# Patient Record
Sex: Male | Born: 1950 | Race: White | Hispanic: No | Marital: Single | Smoking: Former smoker
Health system: Southern US, Community
[De-identification: ages and names within clinical notes are randomized; demographics above are authoritative.]

## PROBLEM LIST (undated history)

## (undated) DIAGNOSIS — R112 Nausea with vomiting, unspecified: Secondary | ICD-10-CM

## (undated) DIAGNOSIS — K458 Other specified abdominal hernia without obstruction or gangrene: Secondary | ICD-10-CM

## (undated) DIAGNOSIS — J449 Chronic obstructive pulmonary disease, unspecified: Secondary | ICD-10-CM

## (undated) DIAGNOSIS — R0600 Dyspnea, unspecified: Secondary | ICD-10-CM

## (undated) DIAGNOSIS — T8859XA Other complications of anesthesia, initial encounter: Secondary | ICD-10-CM

## (undated) DIAGNOSIS — J45909 Unspecified asthma, uncomplicated: Secondary | ICD-10-CM

## (undated) DIAGNOSIS — M25561 Pain in right knee: Secondary | ICD-10-CM

## (undated) DIAGNOSIS — K219 Gastro-esophageal reflux disease without esophagitis: Secondary | ICD-10-CM

## (undated) DIAGNOSIS — Z87442 Personal history of urinary calculi: Secondary | ICD-10-CM

## (undated) DIAGNOSIS — T4145XA Adverse effect of unspecified anesthetic, initial encounter: Secondary | ICD-10-CM

## (undated) DIAGNOSIS — F330 Major depressive disorder, recurrent, mild: Secondary | ICD-10-CM

## (undated) DIAGNOSIS — M1990 Unspecified osteoarthritis, unspecified site: Secondary | ICD-10-CM

## (undated) DIAGNOSIS — N138 Other obstructive and reflux uropathy: Secondary | ICD-10-CM

## (undated) DIAGNOSIS — E785 Hyperlipidemia, unspecified: Secondary | ICD-10-CM

## (undated) DIAGNOSIS — K4090 Unilateral inguinal hernia, without obstruction or gangrene, not specified as recurrent: Secondary | ICD-10-CM

## (undated) DIAGNOSIS — Z9889 Other specified postprocedural states: Secondary | ICD-10-CM

## (undated) HISTORY — PX: CATARACT EXTRACTION: SUR2

## (undated) HISTORY — PX: COLONOSCOPY: SHX174

## (undated) HISTORY — PX: BACK SURGERY: SHX140

## (undated) HISTORY — PX: OTHER SURGICAL HISTORY: SHX169

## (undated) HISTORY — PX: ESOPHAGOGASTRODUODENOSCOPY: SHX1529

## (undated) HISTORY — PX: RETINAL DETACHMENT SURGERY: SHX105

## (undated) HISTORY — PX: NOSE SURGERY: SHX723

---

## 1991-02-07 HISTORY — PX: OTHER SURGICAL HISTORY: SHX169

## 2002-08-01 ENCOUNTER — Encounter: Payer: Self-pay | Admitting: Ophthalmology

## 2002-08-01 ENCOUNTER — Ambulatory Visit (HOSPITAL_COMMUNITY): Admission: RE | Admit: 2002-08-01 | Discharge: 2002-08-02 | Payer: Self-pay | Admitting: Ophthalmology

## 2004-02-10 ENCOUNTER — Ambulatory Visit: Payer: Self-pay | Admitting: Urology

## 2004-05-16 ENCOUNTER — Ambulatory Visit: Payer: Self-pay | Admitting: Internal Medicine

## 2007-04-19 ENCOUNTER — Ambulatory Visit: Payer: Self-pay | Admitting: Nurse Practitioner

## 2008-02-07 HISTORY — PX: HEMORROIDECTOMY: SUR656

## 2012-08-27 ENCOUNTER — Ambulatory Visit: Payer: Self-pay | Admitting: Emergency Medicine

## 2012-08-28 ENCOUNTER — Ambulatory Visit: Payer: Self-pay | Admitting: Emergency Medicine

## 2012-09-19 ENCOUNTER — Ambulatory Visit: Payer: Self-pay | Admitting: Surgery

## 2012-09-19 LAB — CBC WITH DIFFERENTIAL/PLATELET
Eosinophil #: 1 10*3/uL — ABNORMAL HIGH (ref 0.0–0.7)
Eosinophil %: 13.4 %
HCT: 46.9 % (ref 40.0–52.0)
Lymphocyte %: 19.9 %
Neutrophil #: 3.9 10*3/uL (ref 1.4–6.5)
RBC: 5.56 10*6/uL (ref 4.40–5.90)

## 2012-09-19 LAB — BASIC METABOLIC PANEL
EGFR (African American): >60
EGFR (Non-African Amer.): >60
Glucose: 91 mg/dL (ref 65–99)
Osmolality: 274 (ref 275–301)
Potassium: 4.4 mmol/L (ref 3.5–5.1)

## 2012-09-25 ENCOUNTER — Ambulatory Visit: Payer: Self-pay | Admitting: Surgery

## 2014-05-29 NOTE — Op Note (Signed)
PATIENT NAME:  Eric Boyer, Eric Boyer MR#:  [id number] DATE OF BIRTH:  01/21/1951  DATE OF PROCEDURE:  08/27/2012  This patient who is losing weight   and he also has a history of reflux was brought in for upper GI endoscopy.   After putting the scope from the esophagus, which was normal, the middle of esophagus was normal, GE junction was normal. Entering into the stomach the patient had a very large J-shaped stomach. It looked like it was going towards the chest, and by advancing the scope near the prepyloric area I could not enter the duodenum because all of the scope was in the stomach.  I tried many, many times but the pylorus could not be cannulated because of the anatomy of his stomach. I did not see any obvious tumor in the stomach or any mass, or any ulcer disease, but I could not see the duodenum.   RECOMMENDATION: I am going to recommend an upper GI series on him to look at the duodenum and look at the stomach, see whether he has any paraesophageal hernia or not.   I may not be here next monthto see his upper endoscopy results, so I will request that his family doctor to please look for the results in the x-ray department.   PROCEDURE: Colonoscopy.   FINDINGS: Normal colonoscopy. The patient was found to have a few diverticula in the colon, but I was able to go all the way down to the cecum. No evidence of tumor or polyp was seen in the colon.     ____________________________ Alton RevereMasud S. Cecelia ByarsHashmi, MD msh:dm D: 08/27/2012 08:53:22 ET T: 08/27/2012 09:30:47 ET JOB#: [id number]  cc: Emberly Tomasso S. Cecelia ByarsHashmi, MD, <Dictator> Meindert A. Lacie ScottsNiemeyer, MD Meryle ReadyMASUD S Suhail Peloquin MD ELECTRONICALLY SIGNED 08/27/2012 9:46

## 2014-05-29 NOTE — Op Note (Signed)
PATIENT NAME:  Cecilie LowersKIMREY, Jayro M MR#:  [id number] DATE OF BIRTH:  August 28, 1950  DATE OF PROCEDURE:  09/25/2012  PREOPERATIVE DIAGNOSIS: Left back lipoma.   POSTOPERATIVE DIAGNOSIS: Left back lipoma.   PROCEDURE PERFORMED:  Excision of left back lipoma.   SURGEON: Amiliah Campisi A. Mattix Imhof, M.D.   ESTIMATED BLOOD LOSS:  10 mL.  COMPLICATIONS: None.   SPECIMEN: Left back mass with skin pedicle.   ANESTHESIA: MAC local.   INDICATION FOR SURGERY: Mr. Bess KindsKimrey is a pleasant 64 year old male with a tender lower back nodule which was nonfixed and appeared to be relatively nontender. This was felt to be a lipoma and as it was symptomatic I offered him resection.     DETAILS OF PROCEDURE: Formal consent was obtained.  He was laid prone on the operating room table.  I then made an ellipse over his skin and went down to the lipoma. It appeared to be all encapsulated. I then dissected out the capsule on the lipoma using a combination of blunt dissection and Bovie electrocautery. The mass was then sent to pathology. The wound was then irrigated and was made hemostatic. The wound is closed in 2 layers with a running 3-0 Monocryl deep dermal and a 4-0 Monocryl subcuticular. Steri-Strips, Telfa gauze and Tegaderm were then used to complete the dressing. The patient was then awoken, extubated and brought to the postanesthesia care unit. There were no immediate complications. Needle, sponge, and instrument counts were correct at the end of the procedure.      ____________________________ Si Raiderhristopher A. Shields Pautz, MD cal:dp D: 09/25/2012 11:17:04 ET T: 09/25/2012 12:17:53 ET JOB#: [id number]  cc: Cristal Deerhristopher A. Jayvien Rowlette, MD, <Dictator> Jarvis NewcomerHRISTOPHER A Breckyn Ticas MD ELECTRONICALLY SIGNED 10/01/2012 14:59

## 2014-08-26 ENCOUNTER — Other Ambulatory Visit: Payer: Self-pay | Admitting: Nurse Practitioner

## 2014-08-26 DIAGNOSIS — R1010 Upper abdominal pain, unspecified: Secondary | ICD-10-CM

## 2014-09-01 ENCOUNTER — Ambulatory Visit
Admission: RE | Admit: 2014-09-01 | Discharge: 2014-09-01 | Disposition: A | Discharge status: Home / Self Care | Payer: Medicaid Other | Source: Ambulatory Visit | Attending: Nurse Practitioner | Admitting: Nurse Practitioner

## 2014-09-01 DIAGNOSIS — R1010 Upper abdominal pain, unspecified: Secondary | ICD-10-CM

## 2014-09-01 DIAGNOSIS — R948 Abnormal results of function studies of other organs and systems: Secondary | ICD-10-CM | POA: Diagnosis not present

## 2014-09-01 DIAGNOSIS — K5710 Diverticulosis of small intestine without perforation or abscess without bleeding: Secondary | ICD-10-CM | POA: Insufficient documentation

## 2014-09-01 HISTORY — DX: Unspecified asthma, uncomplicated: J45.909

## 2014-09-01 MED ORDER — IOHEXOL 300 MG/ML  SOLN
100.0000 mL | Freq: Once | INTRAMUSCULAR | Status: AC | PRN
  Administered 2014-09-01: 100 mL via INTRAVENOUS

## 2014-10-01 ENCOUNTER — Encounter: Payer: Self-pay | Admitting: *Deleted

## 2014-10-02 ENCOUNTER — Ambulatory Visit: Payer: Medicaid Other | Admitting: Anesthesiology

## 2014-10-02 ENCOUNTER — Encounter
Admission: RE | Disposition: A | Discharge status: Home / Self Care | Payer: Self-pay | Source: Ambulatory Visit | Attending: Unknown Physician Specialty

## 2014-10-02 ENCOUNTER — Encounter: Payer: Self-pay | Admitting: *Deleted

## 2014-10-02 ENCOUNTER — Ambulatory Visit
Admission: RE | Admit: 2014-10-02 | Discharge: 2014-10-02 | Disposition: A | Discharge status: Home / Self Care | Payer: Medicaid Other | Source: Ambulatory Visit | Attending: Unknown Physician Specialty | Admitting: Unknown Physician Specialty

## 2014-10-02 DIAGNOSIS — J449 Chronic obstructive pulmonary disease, unspecified: Secondary | ICD-10-CM | POA: Diagnosis not present

## 2014-10-02 DIAGNOSIS — E785 Hyperlipidemia, unspecified: Secondary | ICD-10-CM | POA: Insufficient documentation

## 2014-10-02 DIAGNOSIS — Z800 Family history of malignant neoplasm of digestive organs: Secondary | ICD-10-CM | POA: Diagnosis not present

## 2014-10-02 DIAGNOSIS — Z87891 Personal history of nicotine dependence: Secondary | ICD-10-CM | POA: Diagnosis not present

## 2014-10-02 DIAGNOSIS — Z1211 Encounter for screening for malignant neoplasm of colon: Secondary | ICD-10-CM | POA: Diagnosis not present

## 2014-10-02 DIAGNOSIS — I517 Cardiomegaly: Secondary | ICD-10-CM | POA: Insufficient documentation

## 2014-10-02 DIAGNOSIS — M1990 Unspecified osteoarthritis, unspecified site: Secondary | ICD-10-CM | POA: Insufficient documentation

## 2014-10-02 DIAGNOSIS — J45909 Unspecified asthma, uncomplicated: Secondary | ICD-10-CM | POA: Insufficient documentation

## 2014-10-02 DIAGNOSIS — K640 First degree hemorrhoids: Secondary | ICD-10-CM | POA: Insufficient documentation

## 2014-10-02 DIAGNOSIS — Z79899 Other long term (current) drug therapy: Secondary | ICD-10-CM | POA: Diagnosis not present

## 2014-10-02 DIAGNOSIS — I2510 Atherosclerotic heart disease of native coronary artery without angina pectoris: Secondary | ICD-10-CM | POA: Insufficient documentation

## 2014-10-02 DIAGNOSIS — K5730 Diverticulosis of large intestine without perforation or abscess without bleeding: Secondary | ICD-10-CM | POA: Insufficient documentation

## 2014-10-02 HISTORY — DX: Unspecified osteoarthritis, unspecified site: M19.90

## 2014-10-02 HISTORY — DX: Other complications of anesthesia, initial encounter: T88.59XA

## 2014-10-02 HISTORY — DX: Chronic obstructive pulmonary disease, unspecified: J44.9

## 2014-10-02 HISTORY — DX: Hyperlipidemia, unspecified: E78.5

## 2014-10-02 HISTORY — DX: Adverse effect of unspecified anesthetic, initial encounter: T41.45XA

## 2014-10-02 HISTORY — DX: Nausea with vomiting, unspecified: R11.2

## 2014-10-02 HISTORY — PX: COLONOSCOPY WITH PROPOFOL: SHX5780

## 2014-10-02 HISTORY — DX: Other specified postprocedural states: Z98.890

## 2014-10-02 SURGERY — COLONOSCOPY WITH PROPOFOL
Anesthesia: General

## 2014-10-02 MED ORDER — EPHEDRINE SULFATE 50 MG/ML IJ SOLN
INTRAMUSCULAR | Status: DC | PRN
  Administered 2014-10-02: 5 mg via INTRAVENOUS

## 2014-10-02 MED ORDER — PHENYLEPHRINE HCL 10 MG/ML IJ SOLN
INTRAMUSCULAR | Status: DC | PRN
  Administered 2014-10-02 (×2): 50 ug via INTRAVENOUS

## 2014-10-02 MED ORDER — PROPOFOL INFUSION 10 MG/ML OPTIME
INTRAVENOUS | Status: DC | PRN
  Administered 2014-10-02: 120 ug/kg/min via INTRAVENOUS

## 2014-10-02 MED ORDER — PROPOFOL 10 MG/ML IV BOLUS
INTRAVENOUS | Status: DC | PRN
  Administered 2014-10-02: 80 mg via INTRAVENOUS

## 2014-10-02 MED ORDER — SODIUM CHLORIDE 0.9 % IV SOLN: INTRAVENOUS | Status: DC

## 2014-10-02 MED ORDER — LACTATED RINGERS IV SOLN
INTRAVENOUS | Status: DC | PRN
  Administered 2014-10-02: 11:00:00 via INTRAVENOUS

## 2014-10-02 NOTE — H&P (Signed)
   Primary Care Physician:  Domenic Schwab, FNP Primary Gastroenterologist:  Dr. Mechele Collin  Pre-Procedure History & Physical: HPI:  Eric Boyer is a 64 y.o. male is here for an colonoscopy.   Past Medical History  Diagnosis Date  . Asthma   . COPD (chronic obstructive pulmonary disease)   . Osteoarthritis   . Hyperlipidemia   . Complication of anesthesia   . PONV (postoperative nausea and vomiting)     Past Surgical History  Procedure Laterality Date  . Cataract extraction    . Brain tumor surgery    . Nose surgery    . Retinal detachment surgery    . Esophagogastroduodenoscopy    . Colonoscopy      Prior to Admission medications   Medication Sig Start Date End Date Taking? Authorizing Provider  albuterol (PROVENTIL HFA;VENTOLIN HFA) 108 (90 BASE) MCG/ACT inhaler Inhale into the lungs every 6 (six) hours as needed for wheezing or shortness of breath.   Yes Historical Provider, MD  atorvastatin (LIPITOR) 10 MG tablet Take 10 mg by mouth daily.   Yes Historical Provider, MD  Fluticasone-Salmeterol (ADVAIR) 250-50 MCG/DOSE AEPB Inhale 1 puff into the lungs 2 (two) times daily.   Yes Historical Provider, MD  lipase/protease/amylase (CREON) 12000 UNITS CPEP capsule Take by mouth.   Yes Historical Provider, MD  omeprazole (PRILOSEC) 20 MG capsule Take 20 mg by mouth daily.   Yes Historical Provider, MD  ondansetron (ZOFRAN-ODT) 4 MG disintegrating tablet Take 4 mg by mouth every 8 (eight) hours as needed for nausea or vomiting.   Yes Historical Provider, MD  tiotropium (SPIRIVA) 18 MCG inhalation capsule Place 18 mcg into inhaler and inhale daily.   Yes Historical Provider, MD    Allergies as of 09/07/2014  . (No Known Allergies)    History reviewed. No pertinent family history.  Social History   Social History  . Marital Status: Single    Spouse Name: N/A  . Number of Children: N/A  . Years of Education: N/A   Occupational History  . Not on file.   Social  History Main Topics  . Smoking status: Former Games developer  . Smokeless tobacco: Never Used  . Alcohol Use: No  . Drug Use: No  . Sexual Activity: Not on file   Other Topics Concern  . Not on file   Social History Narrative    Review of Systems: See HPI, otherwise negative ROS  Physical Exam: BP 111/64 mmHg  Pulse 88  Temp(Src) 100.1 F (37.8 C) (Tympanic)  Resp 14  Ht 6' (1.829 m)  Wt 69.4 kg (153 lb)  BMI 20.75 kg/m2  SpO2 96% General:   Alert,  pleasant and cooperative in NAD Head:  Normocephalic and atraumatic. Neck:  Supple; no masses or thyromegaly. Lungs:  Clear throughout to auscultation.    Heart:  Regular rate and rhythm. Abdomen:  Soft, nontender and nondistended. Normal bowel sounds, without guarding, and without rebound.   Neurologic:  Alert and  oriented x4;  grossly normal neurologically.  Impression/Plan: Eric Boyer is here for an colonoscopy to be performed for FH colon cancer in mother  Risks, benefits, limitations, and alternatives regarding  colonoscopy have been reviewed with the patient.  Questions have been answered.  All parties agreeable.   Lynnae Prude, MD  10/02/2014, 11:16 AM

## 2014-10-02 NOTE — Anesthesia Postprocedure Evaluation (Signed)
  Anesthesia Post-op Note  Patient: Eric Boyer  Procedure(s) Performed: Procedure(s): COLONOSCOPY WITH PROPOFOL (N/A)  Anesthesia type:General  Patient location: PACU  Post pain: Pain level controlled  Post assessment: Post-op Vital signs reviewed, Patient's Cardiovascular Status Stable, Respiratory Function Stable, Patent Airway and No signs of Nausea or vomiting  Post vital signs: Reviewed and stable  Last Vitals:  Filed Vitals:   10/02/14 1150  BP: 85/46  Pulse: 80  Temp: 35.6 C  Resp: 16    Level of consciousness: awake, alert  and patient cooperative  Complications: No apparent anesthesia complications

## 2014-10-02 NOTE — Anesthesia Preprocedure Evaluation (Addendum)
Anesthesia Evaluation  Patient identified by MRN, date of birth, ID band Patient awake    Reviewed: Allergy & Precautions, NPO status , Patient's Chart, lab work & pertinent test results  History of Anesthesia Complications (+) history of anesthetic complications (pt denies)  Airway Mallampati: II       Dental  (+) Upper Dentures, Lower Dentures   Pulmonary COPD COPD inhaler, former smoker (quit x 30 yrs),          Cardiovascular + CAD (cardiomegaly)     Neuro/Psych    GI/Hepatic GERD-  Medicated and Controlled,  Endo/Other    Renal/GU      Musculoskeletal  (+) Arthritis -,   Abdominal   Peds  Hematology   Anesthesia Other Findings   Reproductive/Obstetrics                            Anesthesia Physical Anesthesia Plan  ASA: III  Anesthesia Plan: General   Post-op Pain Management:    Induction: Intravenous  Airway Management Planned: Nasal Cannula  Additional Equipment:   Intra-op Plan:   Post-operative Plan:   Informed Consent: I have reviewed the patients History and Physical, chart, labs and discussed the procedure including the risks, benefits and alternatives for the proposed anesthesia with the patient or authorized representative who has indicated his/her understanding and acceptance.     Plan Discussed with:   Anesthesia Plan Comments:         Anesthesia Quick Evaluation

## 2014-10-02 NOTE — Transfer of Care (Signed)
Immediate Anesthesia Transfer of Care Note  Patient: Eric Boyer  Procedure(s) Performed: Procedure(s): COLONOSCOPY WITH PROPOFOL (N/A)  Patient Location: PACU  Anesthesia Type:General  Level of Consciousness: awake, alert  and oriented  Airway & Oxygen Therapy: Patient Spontanous Breathing and Patient connected to nasal cannula oxygen  Post-op Assessment: Report given to RN and Post -op Vital signs reviewed and stable  Post vital signs: Reviewed and stable  Last Vitals:  Filed Vitals:   10/02/14 1146  BP:   Pulse:   Temp: 35.6 C  Resp:     Complications: No apparent anesthesia complications

## 2014-10-02 NOTE — Op Note (Signed)
[facility] Patient Name: Eric Boyer Procedure Date: 10/02/2014 11:16 AM MRN: [id number] Account #: [id number] Date of Birth: [birth year] Admit Type: Outpatient Age: 64 Room: [facility] ENDO ROOM 1 Gender: Male Note Status: Finalized Procedure:         Colonoscopy Indications:       Screening in patient at increased risk: Family history of                     1st-degree relative with colorectal cancer Providers:         Scot Jun, MD Referring MD:      Meindert A. Lacie Scotts, MD (Referring MD) Medicines:         Propofol per Anesthesia Complications:     No immediate complications. Procedure:         Pre-Anesthesia Assessment:                    - After reviewing the risks and benefits, the patient was                     deemed in satisfactory condition to undergo the procedure.                    After obtaining informed consent, the colonoscope was                     passed under direct vision. Throughout the procedure, the                     patient's blood pressure, pulse, and oxygen saturations                     were monitored continuously. The Colonoscope was                     introduced through the anus and advanced to the the cecum,                     identified by appendiceal orifice and ileocecal valve. The                     colonoscopy was performed without difficulty. The patient                     tolerated the procedure well. The quality of the bowel                     preparation was excellent. Findings:      Many small-mouthed diverticula were found in the sigmoid colon.      Internal hemorrhoids were found during endoscopy. The hemorrhoids were       small and Grade I (internal hemorrhoids that do not prolapse). Impression:        - Diverticulosis in the sigmoid colon.                    - Internal hemorrhoids.                    - No specimens collected. Recommendation:    - Repeat colonoscopy in 5 years for  screening purposes. Scot Jun, MD 10/02/2014 11:43:32 AM This report has been signed electronically. Number of Addenda: 0 Note Initiated On: 10/02/2014 11:16 AM Scope Withdrawal Time: 0 hours 11 minutes 37 seconds  Total Procedure  Duration: 0 hours 17 minutes 58 seconds       [facility]

## 2015-09-28 NOTE — Progress Notes (Signed)
Cardiology Office Note   Date:  09/29/2015   ID:  Eric LowersRichard M Petre, DOB 1950-09-28, MRN [id number]  Referring Doctor:  Domenic SchwabLindley,Cheryl Paulette, FNP   Cardiologist:   Almond LintAileen Kosisochukwu Goldberg, MD   Reason for consultation:  Chief Complaint  Patient presents with  . Other    Ref by Yolonda Kidaherly Lindley, FNP for abnormal Echo.       History of Present Illness: Eric Boyer is a 65 y.o. male who presents for "abnormal echo"  Per available echo report dated 09/08/2015, there were no visible cardiac windows. Was a technically difficult study.  Patient has long-standing history of shortness of breath. This is with walking, relieved after a few minutes post albuterol use. He can usually walk about a mile before he gets short of breath. He uses his inhaler right away and notices significant improvement with of his breathing. He denies chest pain, chest tightness, palpitations, lightheadedness, PND, orthopnea, edema.   ROS:  Please see the history of present illness. Aside from mentioned under HPI, all other systems are reviewed and negative.     Past Medical History:  Diagnosis Date  . Asthma   . Complication of anesthesia   . COPD (chronic obstructive pulmonary disease) (HCC)   . Hyperlipidemia   . Osteoarthritis   . PONV (postoperative nausea and vomiting)     Past Surgical History:  Procedure Laterality Date  . brain tumor surgery    . CATARACT EXTRACTION    . COLONOSCOPY    . COLONOSCOPY WITH PROPOFOL N/A 10/02/2014   Procedure: COLONOSCOPY WITH PROPOFOL;  Surgeon: Scot Junobert T Elliott, MD;  [facility] ENDOSCOPY;  Service: Endoscopy;  Laterality: N/A;  . ESOPHAGOGASTRODUODENOSCOPY    . NOSE SURGERY    . RETINAL DETACHMENT SURGERY       reports that he has quit smoking. He has never used smokeless tobacco. He reports that he does not drink alcohol or use drugs. He smoked at age [age] only.  family history includes Hypertension in his mother and sister. No known CAD in the  family.  Outpatient Medications Prior to Visit  Medication Sig Dispense Refill  . albuterol (PROVENTIL HFA;VENTOLIN HFA) 108 (90 BASE) MCG/ACT inhaler Inhale into the lungs every 6 (six) hours as needed for wheezing or shortness of breath.    . Fluticasone-Salmeterol (ADVAIR) 250-50 MCG/DOSE AEPB Inhale 1 puff into the lungs 2 (two) times daily.    Marland Kitchen. tiotropium (SPIRIVA) 18 MCG inhalation capsule Place 18 mcg into inhaler and inhale daily.    Marland Kitchen. atorvastatin (LIPITOR) 10 MG tablet Take 10 mg by mouth daily.    . lipase/protease/amylase (CREON) 12000 UNITS CPEP capsule Take by mouth.    Marland Kitchen. omeprazole (PRILOSEC) 20 MG capsule Take 20 mg by mouth daily.    . ondansetron (ZOFRAN-ODT) 4 MG disintegrating tablet Take 4 mg by mouth every 8 (eight) hours as needed for nausea or vomiting.     No facility-administered medications prior to visit.      Allergies: Review of patient's allergies indicates no known allergies.    PHYSICAL EXAM: VS:  BP 98/60 (BP Location: Right Arm, Patient Position: Sitting)   Pulse 71   Ht 6' (1.829 m)   Wt 154 lb (69.9 kg)   BMI 20.89 kg/m  , Body mass index is 20.89 kg/m. Wt Readings from Last 3 Encounters:  09/29/15 154 lb (69.9 kg)  10/02/14 153 lb (69.4 kg)  09/01/14 153 lb (69.4 kg)    GENERAL:  well developed, well  nourished, obese, not in acute distress HEENT: normocephalic, pink conjunctivae, anicteric sclerae, no xanthelasma, normal dentition, oropharynx clear NECK:  no neck vein engorgement, JVP normal, no hepatojugular reflux, carotid upstroke brisk and symmetric, no bruit, no thyromegaly, no lymphadenopathy LUNGS:  good respiratory effort, clear to auscultation bilaterally CV:  PMI not displaced, no thrills, no lifts, S1 and S2 within normal limits, no palpable S3 or S4, no murmurs, no rubs, no gallops ABD:  Soft, nontender, nondistended, normoactive bowel sounds, no abdominal aortic bruit, no hepatomegaly, no splenomegaly MS: nontender back, no  kyphosis, no scoliosis, no joint deformities EXT:  2+ DP/PT pulses, no edema, no varicosities, no cyanosis, no clubbing SKIN: warm, nondiaphoretic, normal turgor, no ulcers NEUROPSYCH: alert, oriented to person, place, and time, sensory/motor grossly intact, normal mood, appropriate affect  Recent Labs: No results found for requested labs within last 8760 hours.   Lipid Panel No results found for: CHOL, TRIG, HDL, CHOLHDL, VLDL, LDLCALC, LDLDIRECT   Other studies Reviewed:  EKG:  The ekg from08/23/2017 was personally reviewed by me and it revealed sinus rhythm, 71 BPM. Nonspecific ST-T wave changes.  Additional studies/ records that were reviewed personally reviewed by me today include: None available   ASSESSMENT AND PLAN:  Shortness of breath Recommend echocardiogram for PCP request. Original echocardiogram was technically difficult and no useful information gained from initial report.  Likely related to COPD or asthma. Patient advised to monitor for symptoms of chest tightness chest pains, or shortness of breath does not improve right away with his inhalers. We'll consider stress testing at that time.  Current medicines are reviewed at length with the patient today.  The patient does not have concerns regarding medicines.  Labs/ tests ordered today include:  Orders Placed This Encounter  Procedures  . EKG 12-Lead  . ECHOCARDIOGRAM COMPLETE    I had a lengthy and detailed discussion with the patient regarding diagnoses, prognosis, diagnostic options, treatment options.   I counseled the patient on importance of lifestyle modification including heart healthy diet, regular physical activity.  Disposition:   FU with undersigned after tests  I spent at least 45 minutes with the patient today and more than 50% of the time was spent counseling the patient and coordinating care.     Signed, Almond LintAileen Chapin Arduini, MD  09/29/2015 1:29 PM    [facility]  This  note was generated in part with voice recognition software and I apologize for any typographical errors that were not detected and corrected.

## 2015-09-29 ENCOUNTER — Ambulatory Visit (INDEPENDENT_AMBULATORY_CARE_PROVIDER_SITE_OTHER): Payer: Medicaid Other | Admitting: Cardiology

## 2015-09-29 ENCOUNTER — Encounter: Payer: Self-pay | Admitting: Cardiology

## 2015-09-29 VITALS — BP 98/60 | HR 71 | Ht 72.0 in | Wt 154.0 lb

## 2015-09-29 DIAGNOSIS — R0602 Shortness of breath: Secondary | ICD-10-CM | POA: Diagnosis not present

## 2015-09-29 NOTE — Patient Instructions (Addendum)
Testing/Procedures: Your physician has requested that you have an echocardiogram. Echocardiography is a painless test that uses sound waves to create images of your heart. It provides your doctor with information about the size and shape of your heart and how well your heart's chambers and valves are working. This procedure takes approximately one hour. There are no restrictions for this procedure.   Follow-Up: Your physician recommends that you schedule a follow-up appointment after testing with Dr. Alvino ChapelIngal.  It was a pleasure seeing you today here in the office. Please do not hesitate to give us a call back if you have any further questions. [phone]  Crystal CellarPamela A. RN, BSN

## 2015-10-01 ENCOUNTER — Other Ambulatory Visit: Payer: Medicaid Other

## 2015-10-13 ENCOUNTER — Ambulatory Visit: Payer: Medicaid Other | Admitting: Cardiology

## 2015-10-15 ENCOUNTER — Ambulatory Visit (INDEPENDENT_AMBULATORY_CARE_PROVIDER_SITE_OTHER): Payer: Medicaid Other

## 2015-10-15 ENCOUNTER — Other Ambulatory Visit: Payer: Self-pay

## 2015-10-15 DIAGNOSIS — R0602 Shortness of breath: Secondary | ICD-10-CM

## 2016-02-07 HISTORY — PX: OTHER SURGICAL HISTORY: SHX169

## 2018-04-08 ENCOUNTER — Other Ambulatory Visit: Payer: Self-pay | Admitting: Family Medicine

## 2018-04-08 DIAGNOSIS — M5416 Radiculopathy, lumbar region: Secondary | ICD-10-CM

## 2018-04-17 ENCOUNTER — Other Ambulatory Visit: Payer: Self-pay

## 2018-04-17 ENCOUNTER — Ambulatory Visit
Admission: RE | Admit: 2018-04-17 | Discharge: 2018-04-17 | Disposition: A | Discharge status: Home / Self Care | Payer: Medicare Other | Source: Ambulatory Visit | Attending: Family Medicine | Admitting: Family Medicine

## 2018-04-17 DIAGNOSIS — M5416 Radiculopathy, lumbar region: Secondary | ICD-10-CM | POA: Diagnosis not present

## 2019-03-19 MED ORDER — ALBUTEROL SULFATE HFA 108 (90 BASE) MCG/ACT IN AERS
2.00 | INHALATION_SPRAY | RESPIRATORY_TRACT | Status: DC
Start: ? — End: 2019-03-19

## 2019-03-19 MED ORDER — ATORVASTATIN CALCIUM 20 MG PO TABS
20.00 | ORAL_TABLET | ORAL | Status: DC
Start: 2019-03-19 — End: 2019-03-19

## 2019-03-19 MED ORDER — BUDESONIDE-FORMOTEROL FUMARATE 80-4.5 MCG/ACT IN AERO
2.00 | INHALATION_SPRAY | RESPIRATORY_TRACT | Status: DC
Start: 2019-03-19 — End: 2019-03-19

## 2019-03-19 MED ORDER — CITALOPRAM HYDROBROMIDE 20 MG PO TABS
10.00 | ORAL_TABLET | ORAL | Status: DC
Start: 2019-03-20 — End: 2019-03-19

## 2019-03-19 MED ORDER — BISACODYL 10 MG RE SUPP
10.00 | RECTAL | Status: DC
Start: 2019-03-20 — End: 2019-03-19

## 2019-03-19 MED ORDER — METHOCARBAMOL 500 MG PO TABS
500.00 | ORAL_TABLET | ORAL | Status: DC
Start: 2019-03-19 — End: 2019-03-19

## 2019-03-19 MED ORDER — POTASSIUM CHLORIDE ER 10 MEQ PO CPCR
10.00 | ORAL_CAPSULE | ORAL | Status: DC
Start: 2019-03-20 — End: 2019-03-19

## 2019-03-19 MED ORDER — CHOLECALCIFEROL 25 MCG (1000 UT) PO TABS
5000.00 | ORAL_TABLET | ORAL | Status: DC
Start: 2019-03-20 — End: 2019-03-19

## 2019-03-19 MED ORDER — GENERIC EXTERNAL MEDICATION
Status: DC
Start: ? — End: 2019-03-19

## 2019-03-19 MED ORDER — CELECOXIB 100 MG PO CAPS
100.00 | ORAL_CAPSULE | ORAL | Status: DC
Start: 2019-03-19 — End: 2019-03-19

## 2019-03-19 MED ORDER — K PHOS MONO-SOD PHOS DI & MONO 155-852-130 MG PO TABS
500.00 | ORAL_TABLET | ORAL | Status: DC
Start: 2019-03-19 — End: 2019-03-19

## 2019-03-19 MED ORDER — OXYCODONE HCL 5 MG PO TABS
5.00 | ORAL_TABLET | ORAL | Status: DC
Start: ? — End: 2019-03-19

## 2019-03-19 MED ORDER — CALCIUM CARBONATE 1250 (500 CA) MG PO TABS
ORAL_TABLET | ORAL | Status: DC
Start: 2019-03-19 — End: 2019-03-19

## 2019-03-19 MED ORDER — TIOTROPIUM BROMIDE MONOHYDRATE 18 MCG IN CAPS
18.00 | ORAL_CAPSULE | RESPIRATORY_TRACT | Status: DC
Start: 2019-03-20 — End: 2019-03-19

## 2019-03-19 MED ORDER — ENOXAPARIN SODIUM 40 MG/0.4ML ~~LOC~~ SOLN
40.00 | SUBCUTANEOUS | Status: DC
Start: 2019-03-20 — End: 2019-03-19

## 2019-03-19 MED ORDER — PANTOPRAZOLE SODIUM 40 MG PO TBEC
40.00 | DELAYED_RELEASE_TABLET | ORAL | Status: DC
Start: 2019-03-20 — End: 2019-03-19

## 2019-03-19 MED ORDER — POLYETHYLENE GLYCOL 3350 17 GM/SCOOP PO POWD
17.00 | ORAL | Status: DC
Start: 2019-03-19 — End: 2019-03-19

## 2019-03-19 MED ORDER — SENNOSIDES-DOCUSATE SODIUM 8.6-50 MG PO TABS
2.00 | ORAL_TABLET | ORAL | Status: DC
Start: 2019-03-19 — End: 2019-03-19

## 2019-03-19 MED ORDER — ACETAMINOPHEN 325 MG PO TABS
975.00 | ORAL_TABLET | ORAL | Status: DC
Start: ? — End: 2019-03-19

## 2019-03-19 MED ORDER — LIDOCAINE 5 % EX PTCH
2.00 | MEDICATED_PATCH | CUTANEOUS | Status: DC
Start: 2019-03-20 — End: 2019-03-19

## 2019-03-31 ENCOUNTER — Ambulatory Visit (INDEPENDENT_AMBULATORY_CARE_PROVIDER_SITE_OTHER): Payer: Medicare Other | Admitting: Urology

## 2019-03-31 ENCOUNTER — Other Ambulatory Visit: Payer: Self-pay

## 2019-03-31 ENCOUNTER — Encounter: Payer: Self-pay | Admitting: Urology

## 2019-03-31 ENCOUNTER — Ambulatory Visit: Payer: Medicare Other | Admitting: Physician Assistant

## 2019-03-31 VITALS — BP 124/81 | HR 94 | Ht 72.0 in | Wt 153.0 lb

## 2019-03-31 DIAGNOSIS — R339 Retention of urine, unspecified: Secondary | ICD-10-CM

## 2019-03-31 LAB — BLADDER SCAN AMB NON-IMAGING: Scan Result: 120

## 2019-03-31 NOTE — Progress Notes (Signed)
03/31/2019 8:46 AM   Gerlene Burdock Precious Haws 1950/09/07 [id number]  Referring provider: Gracelyn Nurse, MD [address] [place],  [place] [address]  Chief Complaint  Patient presents with  . Follow-up    HPI: Patient saw Dr. Kathie Rhodes 2017 with stone left kidney and a calyceal diverticulum.  He might have been on Flomax and.  2 weeks ago he had spinal fusion lower back and a failed trial of voiding.  No longer on Flomax.  He was told he might have microscopic hematuria prior to or during the admission.  I did not see evidence of this  Prior to this sometimes flow good other times hard to start.  He will get up 3-4 times a night.  Hourly frequency during the day.       PMH: Past Medical History:  Diagnosis Date  . Asthma   . Complication of anesthesia   . COPD (chronic obstructive pulmonary disease) (HCC)   . Hyperlipidemia   . Osteoarthritis   . PONV (postoperative nausea and vomiting)     Surgical History: Past Surgical History:  Procedure Laterality Date  . brain tumor surgery    . CATARACT EXTRACTION    . COLONOSCOPY    . COLONOSCOPY WITH PROPOFOL N/A 10/02/2014   Procedure: COLONOSCOPY WITH PROPOFOL;  Surgeon: Scot Jun, MD;  Location: [facility] ENDOSCOPY;  Service: Endoscopy;  Laterality: N/A;  . ESOPHAGOGASTRODUODENOSCOPY    . NOSE SURGERY    . RETINAL DETACHMENT SURGERY      Home Medications:  Allergies as of 03/31/2019   No Known Allergies     Medication List       Accurate as of March 31, 2019  8:46 AM. If you have any questions, ask your nurse or doctor.        albuterol 108 (90 Base) MCG/ACT inhaler Commonly known as: VENTOLIN HFA Inhale into the lungs every 6 (six) hours as needed for wheezing or shortness of breath.   alendronate 70 MG tablet Commonly known as: FOSAMAX Take 70 mg by mouth. Wednesday   atorvastatin 20 MG tablet Commonly known as: LIPITOR Take by mouth.   calcium carbonate 1250 (500 Ca) MG chewable tablet Commonly  known as: OS-CAL Chew by mouth.   citalopram 10 MG tablet Commonly known as: CELEXA Take 10 mg by mouth daily.   fluticasone furoate-vilanterol 100-25 MCG/INH Aepb Commonly known as: BREO ELLIPTA Inhale into the lungs.   Fluticasone-Salmeterol 250-50 MCG/DOSE Aepb Commonly known as: ADVAIR Inhale 1 puff into the lungs 2 (two) times daily.   oxyCODONE 5 MG immediate release tablet Commonly known as: Oxy IR/ROXICODONE Take 5 mg by mouth every 6 (six) hours as needed.   ranitidine 150 MG capsule Commonly known as: ZANTAC Take 150 mg by mouth 2 (two) times daily.   tiotropium 18 MCG inhalation capsule Commonly known as: SPIRIVA Place 18 mcg into inhaler and inhale daily.   Vitamin D3 125 MCG (5000 UT) Tabs Take 5,000 Units by mouth daily.       Allergies: No Known Allergies  Family History: Family History  Problem Relation Age of Onset  . Hypertension Mother   . Hypertension Sister     Social History:  reports that he has quit smoking. He has never used smokeless tobacco. He reports that he does not drink alcohol or use drugs.  ROS:  Physical Exam: BP 124/81   Pulse 94   Ht 6' (1.829 m)   Wt 153 lb (69.4 kg)   BMI 20.75 kg/m   Constitutional:  Alert and oriented, No acute distress. HEENT: [place] AT, moist mucus membranes.  Trachea midline, no masses. Cardiovascular: No clubbing, cyanosis, or edema. Respiratory: Normal respiratory effort, no increased work of breathing. GI: Abdomen is soft, nontender, nondistended, no abdominal masses GU: Exam a little bit limiting but 50 to 60 g benign prostate Skin: No rashes, bruises or suspicious lesions. Lymph: No cervical or inguinal adenopathy. Neurologic: Grossly intact, no focal deficits, moving all 4 extremities. Psychiatric: Normal mood and affect.  Laboratory Data: Lab Results  Component Value Date   WBC 7.2 09/19/2012   HGB 16.2 09/19/2012   HCT  46.9 09/19/2012   MCV 84 09/19/2012   PLT 282 09/19/2012    Lab Results  Component Value Date   CREATININE 0.86 09/19/2012    No results found for: PSA  No results found for: TESTOSTERONE  No results found for: HGBA1C  Urinalysis No results found for: COLORURINE, APPEARANCEUR, LABSPEC, PHURINE, GLUCOSEU, HGBUR, BILIRUBINUR, KETONESUR, PROTEINUR, UROBILINOGEN, NITRITE, LEUKOCYTESUR  Pertinent Imaging:   Assessment & Plan: Pathophysiology of retention discussed.  Voiding trial discussed.  Depending on how he does he may benefit from Flomax.  I will send urine for urine culture recognizing it will likely be colonized  There are no diagnoses linked to this encounter.  No follow-ups on file.  Reece Packer, MD  [facility] [address], [place],  [address] [phone]

## 2019-03-31 NOTE — Progress Notes (Signed)
Patient returned to clinic this afternoon for follow-up PVR.  He reports drinking approximately 20 ounces of water during the day and has been able to urinate.  PVR 120 mL.  Voiding trial passed.  No further intervention indicated.  Results for orders placed or performed in visit on 03/31/19  BLADDER SCAN AMB NON-IMAGING  Result Value Ref Range   Scan Result 120     I counseled the patient on signs and symptoms of urinary retention, including lower abdominal pain, lumbar pain, abdominal distention, and the inability to urinate.  I advised him to contact the office for assistance if he develops these symptoms during routine office hours, 8 AM to 5 PM Monday through Friday.  If outside those hours, I advised him  to proceed to the emergency department. He expressed understanding.  Return if symptoms worsen or fail to improve.   Carman Ching, PA-C  03/31/19 12:46 PM

## 2019-03-31 NOTE — Progress Notes (Signed)
Fill and Pull Catheter Removal  Patient is present today for a catheter removal.  Patient was cleaned and prepped in a sterile fashion of sterile water/ saline was instilled into the bladder when the patient felt the urge to urinate. 74ml of water was then drained from the balloon.  A 16FR foley cath was removed from the bladder no complications were noted .  Patient as then given some time to void on their own.  Patient can not void  on their own after some time.  Patient tolerated well.  Performed by: Milas Kocher, CMA  Follow up/ Additional notes: return this aftenoon

## 2019-03-31 NOTE — Addendum Note (Signed)
Addended by: Debarah Crape on: 03/31/2019 01:01 PM   Modules accepted: Orders

## 2020-05-19 ENCOUNTER — Ambulatory Visit: Payer: Medicare Other | Admitting: Dermatology

## 2020-12-16 DIAGNOSIS — J449 Chronic obstructive pulmonary disease, unspecified: Secondary | ICD-10-CM | POA: Diagnosis not present

## 2020-12-23 DIAGNOSIS — F330 Major depressive disorder, recurrent, mild: Secondary | ICD-10-CM | POA: Diagnosis not present

## 2020-12-23 DIAGNOSIS — E782 Mixed hyperlipidemia: Secondary | ICD-10-CM | POA: Diagnosis not present

## 2020-12-23 DIAGNOSIS — K219 Gastro-esophageal reflux disease without esophagitis: Secondary | ICD-10-CM | POA: Diagnosis not present

## 2020-12-23 DIAGNOSIS — J449 Chronic obstructive pulmonary disease, unspecified: Secondary | ICD-10-CM | POA: Diagnosis not present

## 2021-01-18 IMAGING — MR MRI LUMBAR SPINE WITHOUT CONTRAST
4 of 5 series · 25 of 48 positions shown · non-contrast
Comparison: CT 09/01/2014

CLINICAL DATA: Left leg pain with numbness and tingling extending
to the foot.

EXAM:
MRI LUMBAR SPINE WITHOUT CONTRAST
TECHNIQUE: Multiplanar, multisequence MR imaging of the lumbar spine was
performed. No intravenous contrast was administered.

[Series 2: T2 · sagittal · 4.0mm · 0.81mm/px · 6 of 17 slices shown (1 of 2)]
[im 1/17]
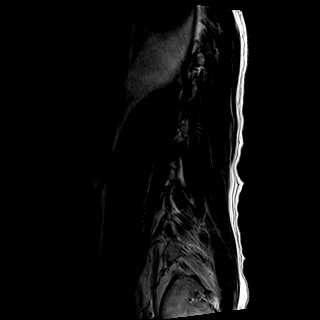
[im 4/17]
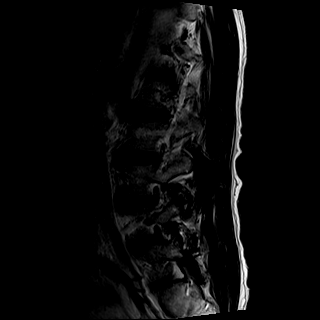
[im 7/17]
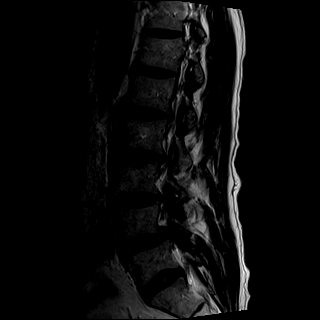
[im 10/17]
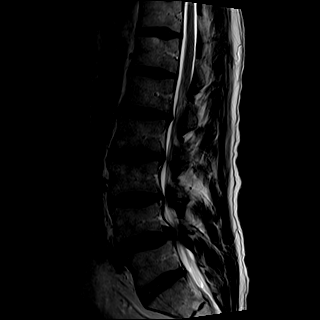
[im 13/17]
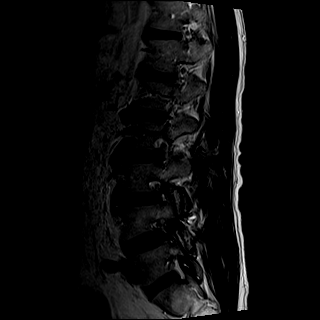
[im 17/17]
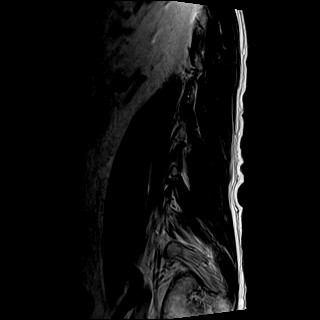

[Series 3: T1 · sagittal · 4.0mm · 0.41mm/px · 6 of 17 slices shown (1 of 2)]
[im 1/17]
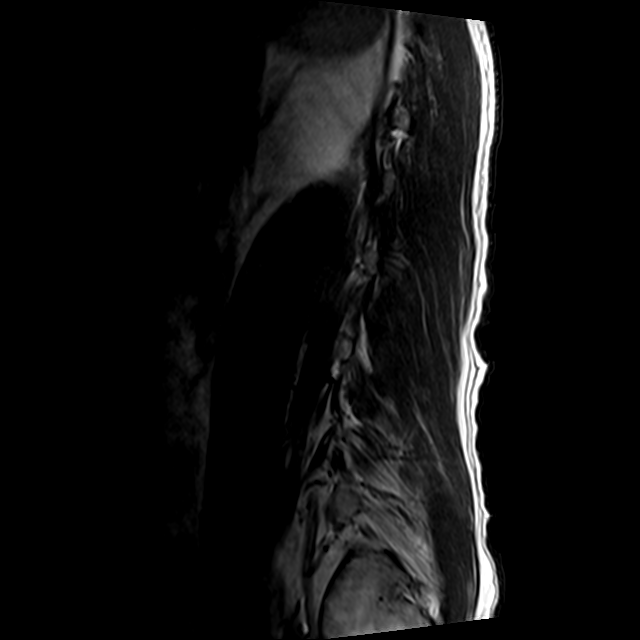
[im 4/17]
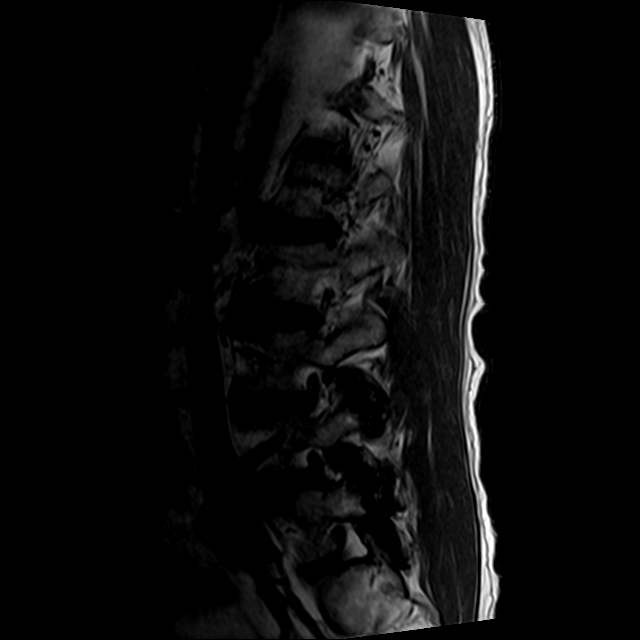
[im 7/17]
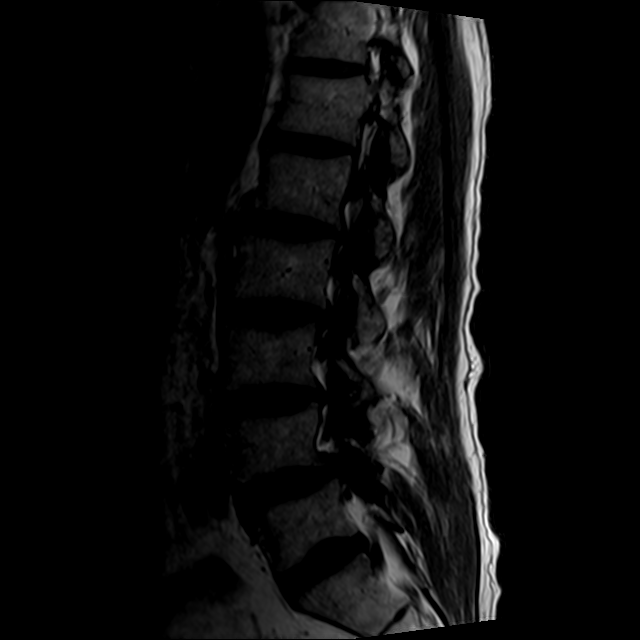
[im 10/17]
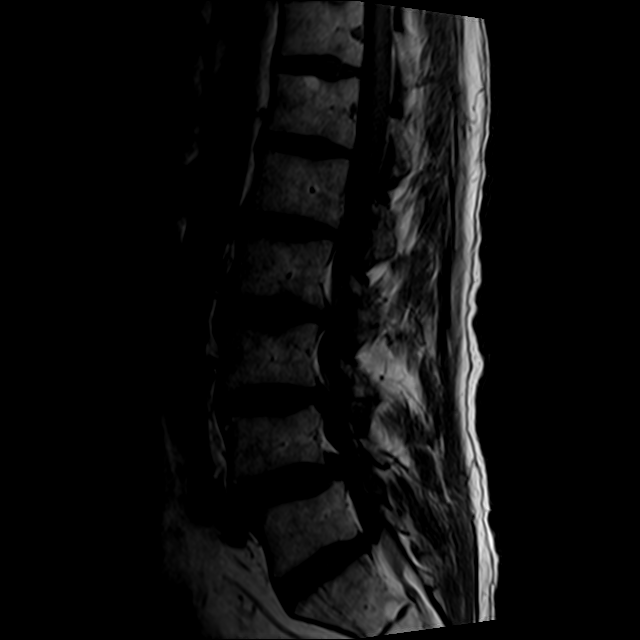
[im 13/17]
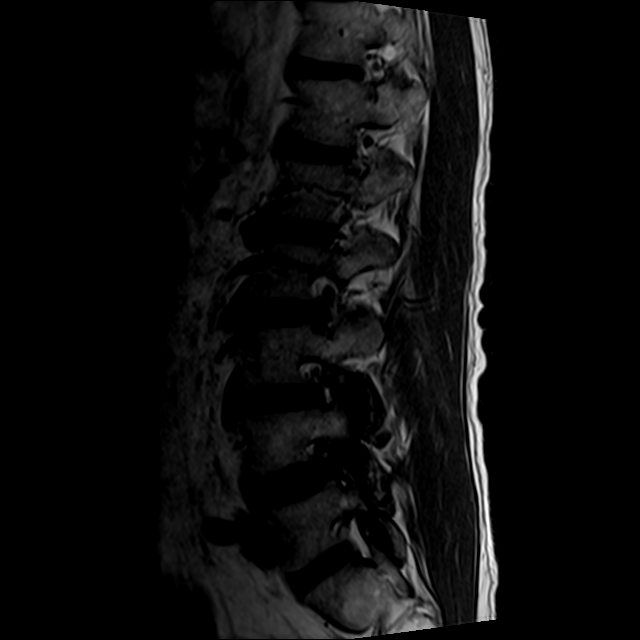
[im 17/17]
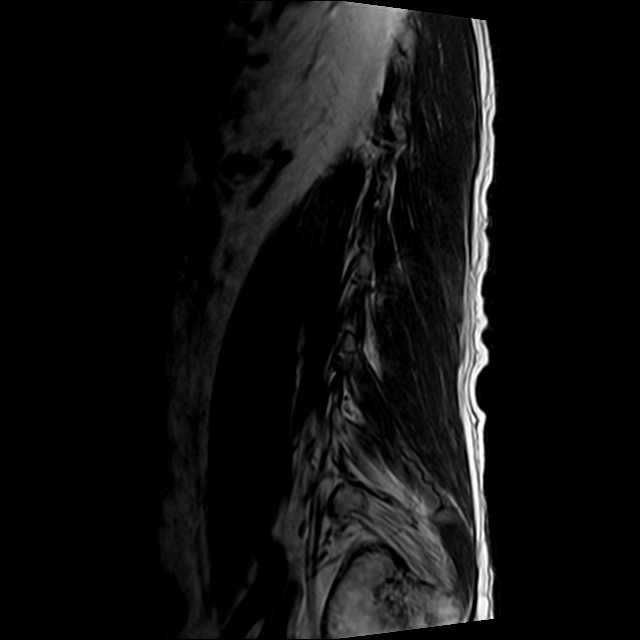

[Series 5: T2 · axial · 4.0mm · 0.78mm/px · z∈[-228,+0]mm · 9 of 40 slices shown (2 of 2)]
[im 1/40]
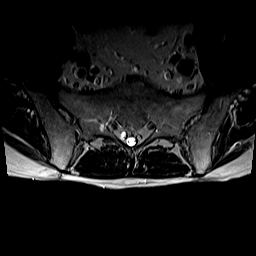
[im 6/40]
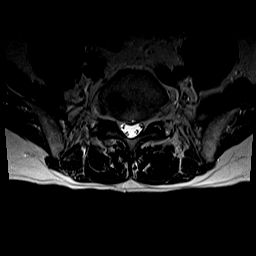
[im 12/40]
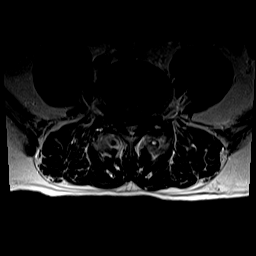
[im 17/40]
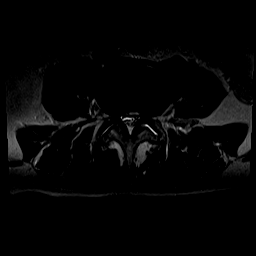
[im 20/40]
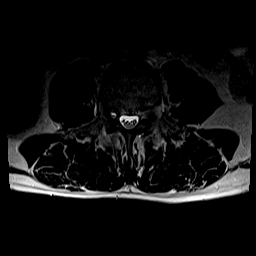
[im 23/40]
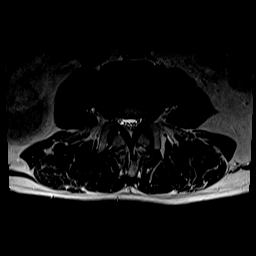
[im 28/40]
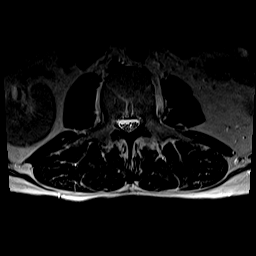
[im 34/40]
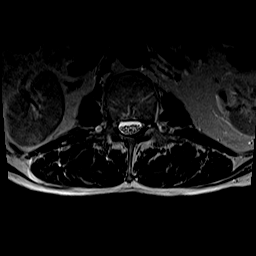
[im 40/40]
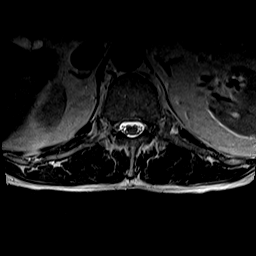

[Series 6: T1 · axial · 4.0mm · 0.39mm/px · z∈[-228,-29]mm · 4 of 40 slices shown (2 of 2)]
[im 1/40]
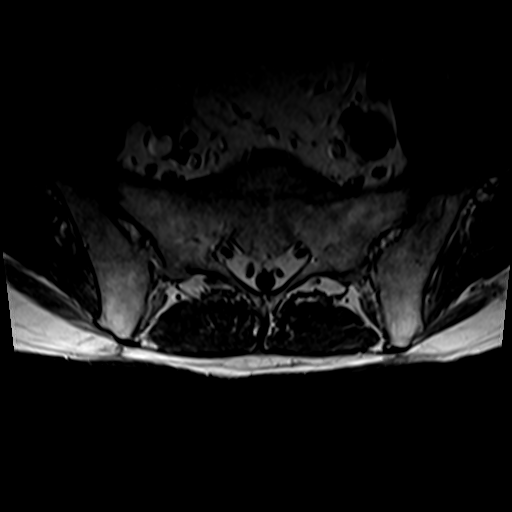
[im 6/40]
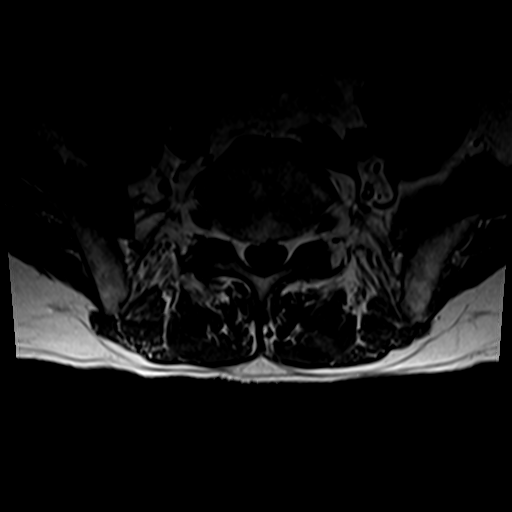
[im 20/40]
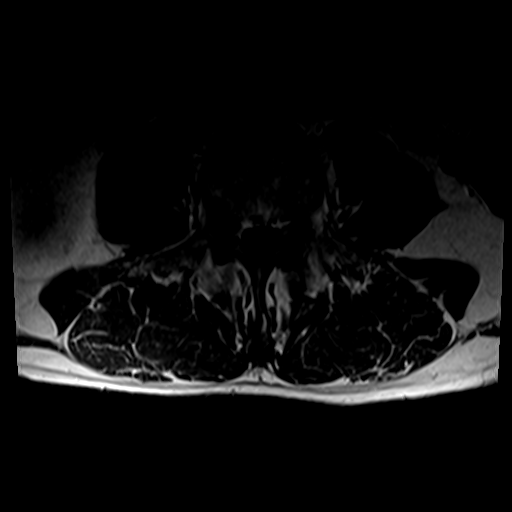
[im 34/40]
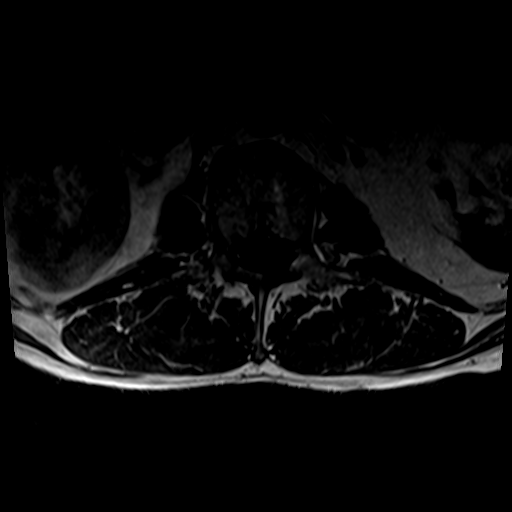

[25 of 48 positions shown; findings below may reference images not displayed]

FINDINGS: Segmentation:  5 lumbar type vertebral bodies.

Alignment: Mild curvature convex to the right. Degenerative
anterolisthesis at L3-4 of 2 mm and L4-5 of 6 mm.

Vertebrae:  No fracture or primary bone lesion.

Conus medullaris and cauda equina: Conus extends to the L1 level.
Conus and cauda equina appear normal.

Paraspinal and other soft tissues: Negative

Disc levels:

No abnormality at T11-12 or T12-L1.

L1-2: Disc degeneration with shallow protrusion towards the right.
Mild stenosis of the lateral recesses right more than left no
visible neural compression

L2-3: Desiccation and bulging of the disc prominent towards the
left. Mild facet and ligamentous hypertrophy. Stenosis of both
lateral recesses and of the left foraminal to extraforaminal region.
Neural compression could occur at this level in the lateral recesses
or in the left foraminal to extraforaminal region.

L3-4: 2 mm of anterolisthesis. Annular bulging. Facet and
ligamentous hypertrophy with some edema, particularly on the left.
Stenosis of the lateral recesses that could possibly cause neural
compression. Mild bilateral foraminal stenosis as well.

L4-5: Advanced bilateral facet arthropathy with 6 mm of
anterolisthesis. Broad-based disc herniation more prominent towards
the right. Severe stenosis at this level that could cause neural
compression on either or both sides. Foraminal stenosis could
compress the exiting L4 nerves.

L5-S1: Annular bulging. Mild facet degeneration. No canal or
foraminal stenosis.
IMPRESSION: L4-5: Severe multifactorial stenosis that could cause neural
compression on either or both sides. Advanced bilateral facet
arthropathy allowing anterolisthesis of 6 mm. Broad-based herniation
of disc material slightly more prominent towards the right.
Bilateral foraminal stenosis.

L1-2: Shallow disc protrusion more prominent towards the right. Mild
narrowing of the lateral recesses right more than left.

L2-3: Bulging of the disc more towards the left. Facet hypertrophy.
Bilateral lateral recess stenosis and left foraminal to
extraforaminal encroachment by bulging disc material. Potential for
neural compression in these locations.

L3-4: Facet arthropathy with 2 mm of anterolisthesis. Annular
bulging. Lateral recess and foraminal narrowing that could possibly
be symptomatic.

## 2023-04-24 ENCOUNTER — Ambulatory Visit: Payer: Self-pay | Admitting: General Surgery

## 2023-04-24 NOTE — H&P (Signed)
 PATIENT PROFILE: Eric Boyer is a 73 y.o. male who presents to the Clinic for consultation at the request of Dr. Letitia Libra for evaluation of right inguinal hernia.  PCP:  Enzo Montgomery, MD  History of Present Illness Eric Boyer is a 73 year old male who presents with an inguinal hernia. He is accompanied by his sister, who assists with communication due to his hearing difficulties. He was referred by his primary care physician, Dr. Laural Benes, for evaluation of the hernia.  He noticed a bulge in his right groin area a couple of months ago, which became more apparent when he was at home and observed a knot on the right side. The bulge is described as 'coming and going' and is associated with activities. He experiences pain that is more pronounced when the bulge is present, although the pain has decreased recently. The pain is now more on the opposite side, which he attributes to back pain. The bulge and pain worsen after eating.  He reports urinary symptoms, including a sensation of incomplete bladder emptying, which he associates with the hernia. He has not been evaluated for prostate issues recently and does not take any medication for the prostate.  His sister mentions that his other sister also had a hernia, which became quite large before being addressed.   PROBLEM LIST: Problem List  Date Reviewed: 06/27/2021          Noted   Major depressive disorder, recurrent, mild (CMS-HCC) 12/23/2020   Hypercalcemia (Chronic) 03/17/2019   Spondylolisthesis, acquired 03/03/2019   Hyperlipemia, mixed 12/27/2016   COPD mixed type (CMS/HHS-HCC) 12/27/2016   BPH with obstruction/lower urinary tract symptoms 02/23/2015   Pancreatic abnormality 10/21/2014   GERD (gastroesophageal reflux disease) 10/21/2014    GENERAL REVIEW OF SYSTEMS:   General ROS: negative for - chills, fatigue, fever, weight gain or weight loss Allergy and Immunology ROS: negative for - hives  Hematological and  Lymphatic ROS: negative for - bleeding problems or bruising, negative for palpable nodes Endocrine ROS: negative for - heat or cold intolerance, hair changes Respiratory ROS: negative for - cough, shortness of breath or wheezing Cardiovascular ROS: no chest pain or palpitations GI ROS: negative for nausea, vomiting, abdominal pain, diarrhea, constipation Musculoskeletal ROS: negative for - joint swelling or muscle pain Neurological ROS: negative for - confusion, syncope Dermatological ROS: negative for pruritus and rash Psychiatric: negative for anxiety, depression, difficulty sleeping and memory loss  MEDICATIONS: Current Outpatient Medications  Medication Sig Dispense Refill   acetaminophen (TYLENOL) 500 MG tablet Take 1,000 mg by mouth every 8 (eight) hours as needed for Pain        albuterol MDI, PROVENTIL, VENTOLIN, PROAIR, HFA 90 mcg/actuation inhaler Inhale 2 inhalations into the lungs every 6 (six) hours as needed for Wheezing 1 each 3   atorvastatin (LIPITOR) 20 MG tablet Take 1 tablet (20 mg total) by mouth at bedtime 100 tablet 1   cholecalciferol, vitamin D3, (VITAMIN D3) 125 mcg (5,000 unit) tablet Take 1 tablet by mouth every morning.       citalopram (CELEXA) 10 MG tablet Take 1 tablet (10 mg total) by mouth once daily 90 tablet 1   dextromethorphan polistirex (DELSYM) 30 mg/5 mL liquid Take by mouth 2 (two) times daily OTC     fluticasone furoate-vilanteroL (BREO ELLIPTA) 100-25 mcg/dose DsDv inhaler Inhale 1 Puff into the lungs once daily 1 each 5   peg 400-propylene glycol, PF, (SYSTANE ULTRA) 0.4-0.3 % ophthalmic drops Place 1 drop into  both eyes as needed for Dry Eyes (night)     SPIRIVA WITH HANDIHALER 18 mcg inhalation capsule Place 1 capsule (18 mcg total) into inhaler and inhale once daily 90 capsule 0   No current facility-administered medications for this visit.    ALLERGIES: Patient has no known allergies.  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   Vitamin D deficiency    Physical Exam   PAST SURGICAL HISTORY: Past Surgical History:  Procedure Laterality Date   COLONOSCOPY  05/28/2003   [facility] (Mother)   UPPER GASTROINTESTINAL ENDOSCOPY  05/28/2003   No repeat per RTE   COLONOSCOPY  10/02/2014   [facility] (Mother): CBF 09/2019   OBLIQUE LATERAL INTERBODY FUSION LUMBAR N/A 03/17/2019   Procedure: L4-5 XLIF/POSTERIOR SPINAL FUSION AND DECOMPRESSION;  Surgeon: Bethel Born, MD;  Location: [facility] OR;  Service: Neurosurgery;  Laterality: N/A;   INSERTION INTERBODY BIOMECHANICAL DEVICE W/ INSTRUMENTATION N/A 03/17/2019   Procedure: INSERTION INTERBODY BIOMECHANICAL DEVICE WITH INTEGRAL ANTERIOR INSTRUMENTATION, TO INTERVERTEBRAL DISC SPACE IN CONJUNCTION INTERBODY ARTHRODESIS, EACH INTERSPACE;  Surgeon: Bethel Born, MD;  Location: [facility] OR;  Service: Neurosurgery;  Laterality: N/A;   PERCUTANEOUS SPINAL FUSION N/A 03/17/2019   Procedure: ARTHRODESIS, POSTERIOR OR POSTEROLATERAL TECHNIQUE, SINGLE LEVEL; LUMBAR (WITH LATERAL TRANSVERSE TECHNIQUE, WHEN PERFORMED);  Surgeon: Bethel Born, MD;  Location: [facility] OR;  Service: Neurosurgery;  Laterality: N/A;   INSTRUMENTATION NON-SEGMENTAL POSTERIOR SPINE N/A 03/17/2019   Procedure: INSTRUMENTATION POSTERIOR SPINE 1/2 VERTEBRAL SEGMENTS W/O FIXATION ADDITIONAL FIFTH;  Surgeon: Bethel Born, MD;   Location: [facility] OR;  Service: Neurosurgery;  Laterality: N/A;   INTRAOPERATIVE FLUOROSCOPY N/A 03/17/2019   Procedure: FLUOROSCOPY;  Surgeon: Bethel Born, MD;  Location: [facility] OR;  Service: Neurosurgery;  Laterality: N/A;   COLONOSCOPY  04/06/2020   Tubular adenomas/FHx CC/Repeat 7yrs/TKT   brain tumor surgery     CATARACT EXTRACTION     HEMORROIDECTOMY  1980's   nose surgery     retinal detatchment       FAMILY HISTORY: Family History  Problem Relation Name Age of Onset   Cancer Brother     Colon cancer Mother       SOCIAL HISTORY: Social History   Socioeconomic History   Marital status: Single  Tobacco Use   Smoking status: Former    Current packs/day: 0.00    Types: Cigarettes    Quit date: 08/24/1968    Years since quitting: 54.7   Smokeless tobacco: Never  Vaping Use   Vaping status: Former  Substance and Sexual Activity   Alcohol use: No    Comment: Quit ETOH 8-9 years ago. Beer 1 case beer twice per week   Drug use: No   Sexual activity: Never    PHYSICAL EXAM: Vitals:   04/24/23 1054  BP: 121/75  Pulse: 82   Body mass index is 22.38 kg/m. Weight: 74.8 kg (165 lb)   GENERAL: Alert, active,  oriented x3  HEENT: Pupils equal reactive to light. Extraocular movements are intact. Sclera clear. Palpebral conjunctiva normal red color.Pharynx clear.  NECK: Supple with no palpable mass and no adenopathy.  LUNGS: Sound clear with no rales rhonchi or wheezes.  HEART: Regular rhythm S1 and S2 without murmur.  ABDOMEN: Soft and depressible, nontender with no palpable mass, no hepatomegaly.  Bulge in the right groin, reducible.  Weakness on the left groin.  EXTREMITIES: Well-developed well-nourished symmetrical with no dependent edema.  NEUROLOGICAL: Awake alert oriented, facial expression symmetrical, moving all extremities.  REVIEW OF DATA: I have reviewed the following data today: No visits with results within 3 Month(s) from this visit.   Latest known visit with results is:  Appointment on 06/28/2022  Component Date Value   Glucose 06/28/2022 93    Sodium 06/28/2022 142    Potassium 06/28/2022 4.1    Chloride 06/28/2022 105    Carbon Dioxide (CO2) 06/28/2022 30.5    Urea Nitrogen (BUN) 06/28/2022 12    Creatinine 06/28/2022 0.9    Glomerular Filtration Ra* 06/28/2022 91    Calcium 06/28/2022 9.4    AST  06/28/2022 13    ALT  06/28/2022 16    Alk Phos (alkaline Phosp* 06/28/2022 70    Albumin 06/28/2022 4.5    Bilirubin, Total 06/28/2022 0.6    Protein, Total 06/28/2022 6.6    A/G Ratio 06/28/2022 2.1    WBC (White Blood Cell Co* 06/28/2022 8.5    RBC (Red Blood Cell Coun* 06/28/2022 5.64    Hemoglobin 06/28/2022 16.0    Hematocrit 06/28/2022 49.0    MCV (Mean Corpuscular Vo* 06/28/2022 86.9    MCH (Mean Corpuscular He* 06/28/2022 28.4    MCHC (Mean Corpuscular H* 06/28/2022 32.7    Platelet Count 06/28/2022 337    RDW-CV (Red Cell Distrib* 06/28/2022 13.2    MPV (Mean Platelet Volum* 06/28/2022 9.5    Neutrophils 06/28/2022 5.14    Lymphocytes 06/28/2022 1.83    Monocytes 06/28/2022 0.83    Eosinophils 06/28/2022 0.60 (H)    Basophils 06/28/2022 0.10 (H)    Neutrophil % 06/28/2022 60.2    Lymphocyte % 06/28/2022 21.5    Monocyte % 06/28/2022 9.7    Eosinophil % 06/28/2022 7.0 (H)    Basophil% 06/28/2022 1.2    Immature Granulocyte % 06/28/2022 0.4    Immature Granulocyte Cou* 06/28/2022 0.03    Cholesterol, Total 06/28/2022 181    Triglyceride 06/28/2022 81    HDL (High Density Lipopr* 24/40/1027 42.6    LDL Calculated 06/28/2022 253    VLDL Cholesterol 06/28/2022 16    Cholesterol/HDL Ratio 06/28/2022 4.2    Color 06/28/2022 Yellow    Clarity 06/28/2022 Clear    Specific Gravity 06/28/2022 1.025    pH, Urine 06/28/2022 5.5    Protein, Urinalysis 06/28/2022 Trace (!)    Glucose, Urinalysis 06/28/2022 Negative    Ketones, Urinalysis 06/28/2022 Negative    Blood, Urinalysis 06/28/2022 Trace (!)     Nitrite, Urinalysis 06/28/2022 Positive (!)    Leukocyte Esterase, Urin* 06/28/2022 3+ (!)    Bilirubin, Urinalysis 06/28/2022 Negative    Urobilinogen, Urinalysis 06/28/2022 0.2    WBC, UA 06/28/2022 72 (H)    Red Blood Cells, Urinaly* 06/28/2022 11 (H)    Bacteria, Urinalysis 06/28/2022 0-5    Squamous Epithelial Cell* 06/28/2022 0    Calcium Oxalate Crystals 06/28/2022 PRESENT (!)    PSA (Prostate Specific A* 06/28/2022 3.41    Assessment & Plan Inguinal Hernia  He has a right-sided inguinal hernia with intermittent bulging and pain, worsened by certain activities and after meals. The hernia is reducible but risks incarceration or strangulation, requiring emergency intervention. Surgical intervention is necessary, with laparoscopic surgery planned. This involves three small incisions to place a mesh over the hernia defect, allowing for evaluation and potential repair of the contralateral side if needed. Postoperative expectations include possible bruising and swelling, with most resuming normal activities in two to three weeks. Schedule laparoscopic hernia repair surgery in 1-2 weeks. Perform the surgery with three small incisions to place a mesh over the hernia defect. Evaluate and repair the contralateral side if necessary. Provide postoperative care instructions, including expectations of bruising and swelling. Prescribe pain medication and antiemetics postoperatively as needed.  Urinary Symptoms   He experiences difficulty with urination, specifically incomplete bladder emptying, likely due to benign prostatic hyperplasia (BPH), a common condition in older males, unrelated to the inguinal hernia. He has not been evaluated by a urologist or started on any medication for BPH. Advise follow-up with a primary care physician or urologist for evaluation of urinary symptoms and possible BPH.  The patient was oriented about the treatment alternatives (observation vs surgical repair). Due to  patient symptoms, repair is recommended. Patient oriented about the surgical procedure, the use of mesh and its risk of complications such as: infection, bleeding, injury to vas deference, vasculature and testicle, injury to bowel or bladder, and chronic pain.   Non-recurrent unilateral inguinal hernia without obstruction or gangrene [K40.90]   Patient and his sister verbalized understanding, all questions were answered, and were agreeable with the plan outlined above.    Carolan Shiver, MD  Electronically signed by Carolan Shiver, MD

## 2023-04-24 NOTE — H&P (View-Only) (Signed)
 PATIENT PROFILE: Eric Boyer is a 73 y.o. male who presents to the Clinic for consultation at the request of Dr. Letitia Libra for evaluation of right inguinal hernia.  PCP:  Enzo Montgomery, MD  History of Present Illness Eric Boyer is a 73 year old male who presents with an inguinal hernia. He is accompanied by his sister, who assists with communication due to his hearing difficulties. He was referred by his primary care physician, Dr. Laural Benes, for evaluation of the hernia.  He noticed a bulge in his right groin area a couple of months ago, which became more apparent when he was at home and observed a knot on the right side. The bulge is described as 'coming and going' and is associated with activities. He experiences pain that is more pronounced when the bulge is present, although the pain has decreased recently. The pain is now more on the opposite side, which he attributes to back pain. The bulge and pain worsen after eating.  He reports urinary symptoms, including a sensation of incomplete bladder emptying, which he associates with the hernia. He has not been evaluated for prostate issues recently and does not take any medication for the prostate.  His sister mentions that his other sister also had a hernia, which became quite large before being addressed.   PROBLEM LIST: Problem List  Date Reviewed: 06/27/2021          Noted   Major depressive disorder, recurrent, mild (CMS-HCC) 12/23/2020   Hypercalcemia (Chronic) 03/17/2019   Spondylolisthesis, acquired 03/03/2019   Hyperlipemia, mixed 12/27/2016   COPD mixed type (CMS/HHS-HCC) 12/27/2016   BPH with obstruction/lower urinary tract symptoms 02/23/2015   Pancreatic abnormality 10/21/2014   GERD (gastroesophageal reflux disease) 10/21/2014    GENERAL REVIEW OF SYSTEMS:   General ROS: negative for - chills, fatigue, fever, weight gain or weight loss Allergy and Immunology ROS: negative for - hives  Hematological and  Lymphatic ROS: negative for - bleeding problems or bruising, negative for palpable nodes Endocrine ROS: negative for - heat or cold intolerance, hair changes Respiratory ROS: negative for - cough, shortness of breath or wheezing Cardiovascular ROS: no chest pain or palpitations GI ROS: negative for nausea, vomiting, abdominal pain, diarrhea, constipation Musculoskeletal ROS: negative for - joint swelling or muscle pain Neurological ROS: negative for - confusion, syncope Dermatological ROS: negative for pruritus and rash Psychiatric: negative for anxiety, depression, difficulty sleeping and memory loss  MEDICATIONS: Current Outpatient Medications  Medication Sig Dispense Refill   acetaminophen (TYLENOL) 500 MG tablet Take 1,000 mg by mouth every 8 (eight) hours as needed for Pain        albuterol MDI, PROVENTIL, VENTOLIN, PROAIR, HFA 90 mcg/actuation inhaler Inhale 2 inhalations into the lungs every 6 (six) hours as needed for Wheezing 1 each 3   atorvastatin (LIPITOR) 20 MG tablet Take 1 tablet (20 mg total) by mouth at bedtime 100 tablet 1   cholecalciferol, vitamin D3, (VITAMIN D3) 125 mcg (5,000 unit) tablet Take 1 tablet by mouth every morning.       citalopram (CELEXA) 10 MG tablet Take 1 tablet (10 mg total) by mouth once daily 90 tablet 1   dextromethorphan polistirex (DELSYM) 30 mg/5 mL liquid Take by mouth 2 (two) times daily OTC     fluticasone furoate-vilanteroL (BREO ELLIPTA) 100-25 mcg/dose DsDv inhaler Inhale 1 Puff into the lungs once daily 1 each 5   peg 400-propylene glycol, PF, (SYSTANE ULTRA) 0.4-0.3 % ophthalmic drops Place 1 drop into  both eyes as needed for Dry Eyes (night)     SPIRIVA WITH HANDIHALER 18 mcg inhalation capsule Place 1 capsule (18 mcg total) into inhaler and inhale once daily 90 capsule 0   No current facility-administered medications for this visit.    ALLERGIES: Patient has no known allergies.  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   Vitamin D deficiency    Physical Exam   PAST SURGICAL HISTORY: Past Surgical History:  Procedure Laterality Date   COLONOSCOPY  05/28/2003   [facility] (Mother)   UPPER GASTROINTESTINAL ENDOSCOPY  05/28/2003   No repeat per RTE   COLONOSCOPY  10/02/2014   [facility] (Mother): CBF 09/2019   OBLIQUE LATERAL INTERBODY FUSION LUMBAR N/A 03/17/2019   Procedure: L4-5 XLIF/POSTERIOR SPINAL FUSION AND DECOMPRESSION;  Surgeon: Bethel Born, MD;  Location: [facility] OR;  Service: Neurosurgery;  Laterality: N/A;   INSERTION INTERBODY BIOMECHANICAL DEVICE W/ INSTRUMENTATION N/A 03/17/2019   Procedure: INSERTION INTERBODY BIOMECHANICAL DEVICE WITH INTEGRAL ANTERIOR INSTRUMENTATION, TO INTERVERTEBRAL DISC SPACE IN CONJUNCTION INTERBODY ARTHRODESIS, EACH INTERSPACE;  Surgeon: Bethel Born, MD;  Location: [facility] OR;  Service: Neurosurgery;  Laterality: N/A;   PERCUTANEOUS SPINAL FUSION N/A 03/17/2019   Procedure: ARTHRODESIS, POSTERIOR OR POSTEROLATERAL TECHNIQUE, SINGLE LEVEL; LUMBAR (WITH LATERAL TRANSVERSE TECHNIQUE, WHEN PERFORMED);  Surgeon: Bethel Born, MD;  Location: [facility] OR;  Service: Neurosurgery;  Laterality: N/A;   INSTRUMENTATION NON-SEGMENTAL POSTERIOR SPINE N/A 03/17/2019   Procedure: INSTRUMENTATION POSTERIOR SPINE 1/2 VERTEBRAL SEGMENTS W/O FIXATION ADDITIONAL FIFTH;  Surgeon: Bethel Born, MD;   Location: [facility] OR;  Service: Neurosurgery;  Laterality: N/A;   INTRAOPERATIVE FLUOROSCOPY N/A 03/17/2019   Procedure: FLUOROSCOPY;  Surgeon: Bethel Born, MD;  Location: [facility] OR;  Service: Neurosurgery;  Laterality: N/A;   COLONOSCOPY  04/06/2020   Tubular adenomas/FHx CC/Repeat 7yrs/TKT   brain tumor surgery     CATARACT EXTRACTION     HEMORROIDECTOMY  1980's   nose surgery     retinal detatchment       FAMILY HISTORY: Family History  Problem Relation Name Age of Onset   Cancer Brother     Colon cancer Mother       SOCIAL HISTORY: Social History   Socioeconomic History   Marital status: Single  Tobacco Use   Smoking status: Former    Current packs/day: 0.00    Types: Cigarettes    Quit date: 08/24/1968    Years since quitting: 54.7   Smokeless tobacco: Never  Vaping Use   Vaping status: Former  Substance and Sexual Activity   Alcohol use: No    Comment: Quit ETOH 8-9 years ago. Beer 1 case beer twice per week   Drug use: No   Sexual activity: Never    PHYSICAL EXAM: Vitals:   04/24/23 1054  BP: 121/75  Pulse: 82   Body mass index is 22.38 kg/m. Weight: 74.8 kg (165 lb)   GENERAL: Alert, active,  oriented x3  HEENT: Pupils equal reactive to light. Extraocular movements are intact. Sclera clear. Palpebral conjunctiva normal red color.Pharynx clear.  NECK: Supple with no palpable mass and no adenopathy.  LUNGS: Sound clear with no rales rhonchi or wheezes.  HEART: Regular rhythm S1 and S2 without murmur.  ABDOMEN: Soft and depressible, nontender with no palpable mass, no hepatomegaly.  Bulge in the right groin, reducible.  Weakness on the left groin.  EXTREMITIES: Well-developed well-nourished symmetrical with no dependent edema.  NEUROLOGICAL: Awake alert oriented, facial expression symmetrical, moving all extremities.  REVIEW OF DATA: I have reviewed the following data today: No visits with results within 3 Month(s) from this visit.   Latest known visit with results is:  Appointment on 06/28/2022  Component Date Value   Glucose 06/28/2022 93    Sodium 06/28/2022 142    Potassium 06/28/2022 4.1    Chloride 06/28/2022 105    Carbon Dioxide (CO2) 06/28/2022 30.5    Urea Nitrogen (BUN) 06/28/2022 12    Creatinine 06/28/2022 0.9    Glomerular Filtration Ra* 06/28/2022 91    Calcium 06/28/2022 9.4    AST  06/28/2022 13    ALT  06/28/2022 16    Alk Phos (alkaline Phosp* 06/28/2022 70    Albumin 06/28/2022 4.5    Bilirubin, Total 06/28/2022 0.6    Protein, Total 06/28/2022 6.6    A/G Ratio 06/28/2022 2.1    WBC (White Blood Cell Co* 06/28/2022 8.5    RBC (Red Blood Cell Coun* 06/28/2022 5.64    Hemoglobin 06/28/2022 16.0    Hematocrit 06/28/2022 49.0    MCV (Mean Corpuscular Vo* 06/28/2022 86.9    MCH (Mean Corpuscular He* 06/28/2022 28.4    MCHC (Mean Corpuscular H* 06/28/2022 32.7    Platelet Count 06/28/2022 337    RDW-CV (Red Cell Distrib* 06/28/2022 13.2    MPV (Mean Platelet Volum* 06/28/2022 9.5    Neutrophils 06/28/2022 5.14    Lymphocytes 06/28/2022 1.83    Monocytes 06/28/2022 0.83    Eosinophils 06/28/2022 0.60 (H)    Basophils 06/28/2022 0.10 (H)    Neutrophil % 06/28/2022 60.2    Lymphocyte % 06/28/2022 21.5    Monocyte % 06/28/2022 9.7    Eosinophil % 06/28/2022 7.0 (H)    Basophil% 06/28/2022 1.2    Immature Granulocyte % 06/28/2022 0.4    Immature Granulocyte Cou* 06/28/2022 0.03    Cholesterol, Total 06/28/2022 181    Triglyceride 06/28/2022 81    HDL (High Density Lipopr* 24/40/1027 42.6    LDL Calculated 06/28/2022 253    VLDL Cholesterol 06/28/2022 16    Cholesterol/HDL Ratio 06/28/2022 4.2    Color 06/28/2022 Yellow    Clarity 06/28/2022 Clear    Specific Gravity 06/28/2022 1.025    pH, Urine 06/28/2022 5.5    Protein, Urinalysis 06/28/2022 Trace (!)    Glucose, Urinalysis 06/28/2022 Negative    Ketones, Urinalysis 06/28/2022 Negative    Blood, Urinalysis 06/28/2022 Trace (!)     Nitrite, Urinalysis 06/28/2022 Positive (!)    Leukocyte Esterase, Urin* 06/28/2022 3+ (!)    Bilirubin, Urinalysis 06/28/2022 Negative    Urobilinogen, Urinalysis 06/28/2022 0.2    WBC, UA 06/28/2022 72 (H)    Red Blood Cells, Urinaly* 06/28/2022 11 (H)    Bacteria, Urinalysis 06/28/2022 0-5    Squamous Epithelial Cell* 06/28/2022 0    Calcium Oxalate Crystals 06/28/2022 PRESENT (!)    PSA (Prostate Specific A* 06/28/2022 3.41    Assessment & Plan Inguinal Hernia  He has a right-sided inguinal hernia with intermittent bulging and pain, worsened by certain activities and after meals. The hernia is reducible but risks incarceration or strangulation, requiring emergency intervention. Surgical intervention is necessary, with laparoscopic surgery planned. This involves three small incisions to place a mesh over the hernia defect, allowing for evaluation and potential repair of the contralateral side if needed. Postoperative expectations include possible bruising and swelling, with most resuming normal activities in two to three weeks. Schedule laparoscopic hernia repair surgery in 1-2 weeks. Perform the surgery with three small incisions to place a mesh over the hernia defect. Evaluate and repair the contralateral side if necessary. Provide postoperative care instructions, including expectations of bruising and swelling. Prescribe pain medication and antiemetics postoperatively as needed.  Urinary Symptoms   He experiences difficulty with urination, specifically incomplete bladder emptying, likely due to benign prostatic hyperplasia (BPH), a common condition in older males, unrelated to the inguinal hernia. He has not been evaluated by a urologist or started on any medication for BPH. Advise follow-up with a primary care physician or urologist for evaluation of urinary symptoms and possible BPH.  The patient was oriented about the treatment alternatives (observation vs surgical repair). Due to  patient symptoms, repair is recommended. Patient oriented about the surgical procedure, the use of mesh and its risk of complications such as: infection, bleeding, injury to vas deference, vasculature and testicle, injury to bowel or bladder, and chronic pain.   Non-recurrent unilateral inguinal hernia without obstruction or gangrene [K40.90]   Patient and his sister verbalized understanding, all questions were answered, and were agreeable with the plan outlined above.    Carolan Shiver, MD  Electronically signed by Carolan Shiver, MD

## 2023-04-27 ENCOUNTER — Encounter
Admission: RE | Admit: 2023-04-27 | Discharge: 2023-04-27 | Disposition: A | Discharge status: Home / Self Care | Source: Ambulatory Visit | Attending: General Surgery | Admitting: General Surgery

## 2023-04-27 ENCOUNTER — Other Ambulatory Visit: Payer: Self-pay

## 2023-04-27 VITALS — Ht 72.0 in | Wt 165.0 lb

## 2023-04-27 DIAGNOSIS — J984 Other disorders of lung: Secondary | ICD-10-CM | POA: Insufficient documentation

## 2023-04-27 DIAGNOSIS — Z01818 Encounter for other preprocedural examination: Secondary | ICD-10-CM | POA: Insufficient documentation

## 2023-04-27 DIAGNOSIS — Z01810 Encounter for preprocedural cardiovascular examination: Secondary | ICD-10-CM | POA: Diagnosis not present

## 2023-04-27 DIAGNOSIS — Z01812 Encounter for preprocedural laboratory examination: Secondary | ICD-10-CM

## 2023-04-27 HISTORY — DX: Pain in right knee: M25.561

## 2023-04-27 HISTORY — DX: Major depressive disorder, recurrent, mild: F33.0

## 2023-04-27 HISTORY — DX: Unilateral inguinal hernia, without obstruction or gangrene, not specified as recurrent: K40.90

## 2023-04-27 HISTORY — DX: Gastro-esophageal reflux disease without esophagitis: K21.9

## 2023-04-27 HISTORY — DX: Other obstructive and reflux uropathy: N13.8

## 2023-04-27 LAB — CBC
HCT: 46.2 % (ref 39.0–52.0)
Hemoglobin: 15.5 g/dL (ref 13.0–17.0)
MCH: 28.5 pg (ref 26.0–34.0)
MCHC: 33.5 g/dL (ref 30.0–36.0)
MCV: 84.9 fL (ref 80.0–100.0)
Platelets: 310 10*3/uL (ref 150–400)
RBC: 5.44 MIL/uL (ref 4.22–5.81)
RDW: 13.2 % (ref 11.5–15.5)
WBC: 10 10*3/uL (ref 4.0–10.5)
nRBC: 0 % (ref 0.0–0.2)

## 2023-04-27 LAB — BASIC METABOLIC PANEL
Anion gap: 7 (ref 5–15)
BUN: 11 mg/dL (ref 8–23)
CO2: 27 mmol/L (ref 22–32)
Calcium: 9.4 mg/dL (ref 8.9–10.3)
Chloride: 109 mmol/L (ref 98–111)
Creatinine, Ser: 0.94 mg/dL (ref 0.61–1.24)
GFR, Estimated: >60 mL/min (ref >60)
Glucose, Bld: 118 mg/dL — ABNORMAL HIGH (ref 70–99)
Potassium: 3.5 mmol/L (ref 3.5–5.1)
Sodium: 143 mmol/L (ref 135–145)

## 2023-04-27 NOTE — Patient Instructions (Addendum)
 Your procedure is scheduled on: Monday 04/30/23 Arrive at 8:00 am  Report to the Registration Desk on the 1st floor of the [facility] If your arrival time is 6:00 am, do not arrive before that time as the [facility] entrance doors do not open until 6:00 am.  REMEMBER: Instructions that are not followed completely may result in serious medical risk, up to and including death; or upon the discretion of your surgeon and anesthesiologist your surgery may need to be rescheduled.  Do not eat food or drink fluids after midnight the night before surgery.  No gum chewing or hard candies.   One week prior to surgery: Stop Anti-inflammatories (NSAIDS) such as Advil, Aleve, Ibuprofen, Motrin, Naproxen, Naprosyn and Aspirin based products such as Excedrin, Goody's Powder, BC Powder. Stop ANY OVER THE COUNTER supplements until after surgery calcium carbonate (OS-CAL) 1250 (500 Ca)  Cholecalciferol (VITAMIN D3) 5000 units   You may however, continue to take Tylenol if needed for pain up until the day of surgery.   Continue taking all of your other prescription medications up until the day of surgery.  ON THE DAY OF SURGERY ONLY TAKE THESE MEDICATIONS WITH SIPS OF WATER:  citalopram (CELEXA) 10 MG   Use inhalers on the day of surgery and bring to the hospital. albuterol (PROVENTIL HFA;VENTOLIN HFA) 108 (90 BASE) MCG/ACT inhaler  fluticasone furoate-vilanterol (BREO ELLIPTA) 100-25 MCG/INH AEPB  tiotropium (SPIRIVA) 18 MCG inhalation capsule   No Alcohol for 24 hours before or after surgery.  No Smoking including e-cigarettes for 24 hours before surgery.  No chewable tobacco products for at least 6 hours before surgery.  No nicotine patches on the day of surgery.  Do not use any "recreational" drugs for at least a week (preferably 2 weeks) before your surgery.  Please be advised that the combination of cocaine and anesthesia may have negative outcomes, up to and including death. If you test  positive for cocaine, your surgery will be cancelled.  On the morning of surgery brush your teeth with toothpaste and water, you may rinse your mouth with mouthwash if you wish. Do not swallow any toothpaste or mouthwash.  Use CHG Soap or wipes as directed on instruction sheet.  Do not wear jewelry, make-up, hairpins, clips or nail polish.  For welded (permanent) jewelry: bracelets, anklets, waist bands, etc.  Please have this removed prior to surgery.  If it is not removed, there is a chance that hospital personnel will need to cut it off on the day of surgery.  Do not wear lotions, powders, or perfumes.   Do not shave body hair from the neck down 48 hours before surgery.  Contact lenses, hearing aids and dentures may not be worn into surgery.  Do not bring valuables to the hospital. [facility] is not responsible for any missing/lost belongings or valuables.    Notify your doctor if there is any change in your medical condition (cold, fever, infection).  Wear comfortable clothing (specific to your surgery type) to the hospital.  After surgery, you can help prevent lung complications by doing breathing exercises.  Take deep breaths and cough every 1-2 hours. Your doctor may order a device called an Incentive Spirometer to help you take deep breaths. When coughing or sneezing, hold a pillow firmly against your incision with both hands. This is called "splinting." Doing this helps protect your incision. It also decreases belly discomfort.  If you are being admitted to the hospital overnight, leave your suitcase in  the car. After surgery it may be brought to your room.  In case of increased patient census, it may be necessary for you, the patient, to continue your postoperative care in the Same Day Surgery department.  If you are being discharged the day of surgery, you will not be allowed to drive home. You will need a responsible individual to drive you home and stay with you for 24  hours after surgery.   If you are taking public transportation, you will need to have a responsible individual with you.  Please call the Pre-admissions Testing Dept. at [phone] if you have any questions about these instructions.  Surgery Visitation Policy:  Patients having surgery or a procedure may have two visitors.  Children under the age of 12 must have an adult with them who is not the patient.  Temporary Visitor Restrictions Due to increasing cases of flu, RSV and COVID-19: Children ages 24 and under will not be able to visit patients in [facility] hospitals under most circumstances.  Inpatient Visitation:    Visiting hours are 7 a.m. to 8 p.m. Up to four visitors are allowed at one time in a patient room. The visitors may rotate out with other people during the day.  One visitor age 53 or older may stay with the patient overnight and must be in the room by 8 p.m.     Preparing for Surgery with CHLORHEXIDINE GLUCONATE (CHG) Soap  Chlorhexidine Gluconate (CHG) Soap  o An antiseptic cleaner that kills germs and bonds with the skin to continue killing germs even after washing  o Used for showering the night before surgery and morning of surgery  Before surgery, you can play an important role by reducing the number of germs on your skin.  CHG (Chlorhexidine gluconate) soap is an antiseptic cleanser which kills germs and bonds with the skin to continue killing germs even after washing.  Please do not use if you have an allergy to CHG or antibacterial soaps. If your skin becomes reddened/irritated stop using the CHG.  1. Shower the NIGHT BEFORE SURGERY and the MORNING OF SURGERY with CHG soap.  2. If you choose to wash your hair, wash your hair first as usual with your normal shampoo.  3. After shampooing, rinse your hair and body thoroughly to remove the shampoo.  4. Use CHG as you would any other liquid soap. You can apply CHG directly to the skin and wash gently  with a scrungie or a clean washcloth.  5. Apply the CHG soap to your body only from the neck down. Do not use on open wounds or open sores. Avoid contact with your eyes, ears, mouth, and genitals (private parts). Wash face and genitals (private parts) with your normal soap.  6. Wash thoroughly, paying special attention to the area where your surgery will be performed.  7. Thoroughly rinse your body with warm water.  8. Do not shower/wash with your normal soap after using and rinsing off the CHG soap.  9. Pat yourself dry with a clean towel.  10. Wear clean pajamas to bed the night before surgery.  12. Place clean sheets on your bed the night of your first shower and do not sleep with pets.  13. Shower again with the CHG soap on the day of surgery prior to arriving at the hospital.  14. Do not apply any deodorants/lotions/powders.  15. Please wear clean clothes to the hospital.

## 2023-04-30 ENCOUNTER — Ambulatory Visit: Payer: Self-pay | Admitting: Urgent Care

## 2023-04-30 ENCOUNTER — Ambulatory Visit: Admitting: Certified Registered"

## 2023-04-30 ENCOUNTER — Encounter
Admission: RE | Disposition: A | Discharge status: Home / Self Care | Payer: Self-pay | Source: Home / Self Care | Attending: General Surgery

## 2023-04-30 ENCOUNTER — Other Ambulatory Visit: Payer: Self-pay

## 2023-04-30 ENCOUNTER — Ambulatory Visit
Admission: RE | Admit: 2023-04-30 | Discharge: 2023-04-30 | Disposition: A | Discharge status: Home / Self Care | Attending: General Surgery | Admitting: General Surgery

## 2023-04-30 ENCOUNTER — Encounter: Payer: Self-pay | Admitting: General Surgery

## 2023-04-30 DIAGNOSIS — E782 Mixed hyperlipidemia: Secondary | ICD-10-CM | POA: Insufficient documentation

## 2023-04-30 DIAGNOSIS — M4310 Spondylolisthesis, site unspecified: Secondary | ICD-10-CM | POA: Diagnosis not present

## 2023-04-30 DIAGNOSIS — R0602 Shortness of breath: Secondary | ICD-10-CM | POA: Insufficient documentation

## 2023-04-30 DIAGNOSIS — Z79899 Other long term (current) drug therapy: Secondary | ICD-10-CM | POA: Diagnosis not present

## 2023-04-30 DIAGNOSIS — Z87891 Personal history of nicotine dependence: Secondary | ICD-10-CM | POA: Insufficient documentation

## 2023-04-30 DIAGNOSIS — K219 Gastro-esophageal reflux disease without esophagitis: Secondary | ICD-10-CM | POA: Diagnosis not present

## 2023-04-30 DIAGNOSIS — N401 Enlarged prostate with lower urinary tract symptoms: Secondary | ICD-10-CM | POA: Diagnosis not present

## 2023-04-30 DIAGNOSIS — K4020 Bilateral inguinal hernia, without obstruction or gangrene, not specified as recurrent: Secondary | ICD-10-CM | POA: Insufficient documentation

## 2023-04-30 DIAGNOSIS — Z7951 Long term (current) use of inhaled steroids: Secondary | ICD-10-CM | POA: Diagnosis not present

## 2023-04-30 DIAGNOSIS — F338 Other recurrent depressive disorders: Secondary | ICD-10-CM | POA: Insufficient documentation

## 2023-04-30 DIAGNOSIS — J449 Chronic obstructive pulmonary disease, unspecified: Secondary | ICD-10-CM | POA: Diagnosis not present

## 2023-04-30 DIAGNOSIS — D176 Benign lipomatous neoplasm of spermatic cord: Secondary | ICD-10-CM | POA: Diagnosis not present

## 2023-04-30 HISTORY — PX: XI ROBOTIC ASSISTED INGUINAL HERNIA REPAIR WITH MESH: SHX6706

## 2023-04-30 SURGERY — REPAIR, HERNIA, INGUINAL, ROBOT-ASSISTED, LAPAROSCOPIC, USING MESH
Anesthesia: General | Laterality: Bilateral

## 2023-04-30 MED ORDER — LIDOCAINE HCL (CARDIAC) PF 100 MG/5ML IV SOSY
PREFILLED_SYRINGE | INTRAVENOUS | Status: DC | PRN
  Administered 2023-04-30: 60 mg via INTRAVENOUS

## 2023-04-30 MED ORDER — DEXAMETHASONE SODIUM PHOSPHATE 10 MG/ML IJ SOLN
INTRAMUSCULAR | Status: DC | PRN
  Administered 2023-04-30: 10 mg via INTRAVENOUS

## 2023-04-30 MED ORDER — PHENYLEPHRINE 80 MCG/ML (10ML) SYRINGE FOR IV PUSH (FOR BLOOD PRESSURE SUPPORT)
PREFILLED_SYRINGE | INTRAVENOUS | Status: AC
  Filled 2023-04-30: qty 10

## 2023-04-30 MED ORDER — BUPIVACAINE-EPINEPHRINE 0.25% -1:200000 IJ SOLN
INTRAMUSCULAR | Status: DC | PRN
  Administered 2023-04-30: 30 mL

## 2023-04-30 MED ORDER — FENTANYL CITRATE (PF) 100 MCG/2ML IJ SOLN
INTRAMUSCULAR | Status: DC | PRN
  Administered 2023-04-30 (×2): 50 ug via INTRAVENOUS

## 2023-04-30 MED ORDER — ROCURONIUM BROMIDE 10 MG/ML (PF) SYRINGE
PREFILLED_SYRINGE | INTRAVENOUS | Status: AC
  Filled 2023-04-30: qty 10

## 2023-04-30 MED ORDER — CHLORHEXIDINE GLUCONATE 0.12 % MT SOLN
15.0000 mL | Freq: Once | OROMUCOSAL | Status: AC
  Administered 2023-04-30: 15 mL via OROMUCOSAL

## 2023-04-30 MED ORDER — 0.9 % SODIUM CHLORIDE (POUR BTL) OPTIME
TOPICAL | Status: DC | PRN
  Administered 2023-04-30: 500 mL

## 2023-04-30 MED ORDER — KETOROLAC TROMETHAMINE 30 MG/ML IJ SOLN
INTRAMUSCULAR | Status: AC
  Filled 2023-04-30: qty 1

## 2023-04-30 MED ORDER — PROPOFOL 10 MG/ML IV BOLUS
INTRAVENOUS | Status: DC | PRN
  Administered 2023-04-30: 100 mg via INTRAVENOUS

## 2023-04-30 MED ORDER — PHENYLEPHRINE 80 MCG/ML (10ML) SYRINGE FOR IV PUSH (FOR BLOOD PRESSURE SUPPORT)
PREFILLED_SYRINGE | INTRAVENOUS | Status: DC | PRN
  Administered 2023-04-30 (×3): 80 ug via INTRAVENOUS

## 2023-04-30 MED ORDER — DEXAMETHASONE SODIUM PHOSPHATE 10 MG/ML IJ SOLN
INTRAMUSCULAR | Status: AC
  Filled 2023-04-30: qty 1

## 2023-04-30 MED ORDER — FENTANYL CITRATE (PF) 100 MCG/2ML IJ SOLN
INTRAMUSCULAR | Status: AC
  Filled 2023-04-30: qty 2

## 2023-04-30 MED ORDER — CHLORHEXIDINE GLUCONATE 0.12 % MT SOLN
OROMUCOSAL | Status: AC
  Filled 2023-04-30: qty 15

## 2023-04-30 MED ORDER — LACTATED RINGERS IV SOLN: INTRAVENOUS | Status: DC

## 2023-04-30 MED ORDER — OXYCODONE HCL 5 MG PO TABS
ORAL_TABLET | ORAL | Status: AC
  Filled 2023-04-30: qty 1

## 2023-04-30 MED ORDER — PROPOFOL 10 MG/ML IV BOLUS
INTRAVENOUS | Status: AC
  Filled 2023-04-30: qty 20

## 2023-04-30 MED ORDER — ONDANSETRON HCL 4 MG/2ML IJ SOLN
INTRAMUSCULAR | Status: DC | PRN
  Administered 2023-04-30: 4 mg via INTRAVENOUS

## 2023-04-30 MED ORDER — CEFAZOLIN SODIUM-DEXTROSE 2-4 GM/100ML-% IV SOLN
2.0000 g | INTRAVENOUS | Status: AC
  Administered 2023-04-30: 2 g via INTRAVENOUS

## 2023-04-30 MED ORDER — OXYCODONE HCL 5 MG PO TABS
5.0000 mg | ORAL_TABLET | Freq: Once | ORAL | Status: AC | PRN
  Administered 2023-04-30: 5 mg via ORAL

## 2023-04-30 MED ORDER — ACETAMINOPHEN 10 MG/ML IV SOLN
INTRAVENOUS | Status: DC | PRN
Start: 2023-04-30 — End: 2023-04-30
  Administered 2023-04-30: 1000 mg via INTRAVENOUS

## 2023-04-30 MED ORDER — ONDANSETRON HCL 4 MG/2ML IJ SOLN
INTRAMUSCULAR | Status: AC
  Filled 2023-04-30: qty 2

## 2023-04-30 MED ORDER — ORAL CARE MOUTH RINSE: 15.0000 mL | Freq: Once | OROMUCOSAL | Status: AC

## 2023-04-30 MED ORDER — OXYCODONE HCL 5 MG/5ML PO SOLN: 5.0000 mg | Freq: Once | ORAL | Status: AC | PRN

## 2023-04-30 MED ORDER — BUPIVACAINE-EPINEPHRINE (PF) 0.25% -1:200000 IJ SOLN
INTRAMUSCULAR | Status: AC
  Filled 2023-04-30: qty 30

## 2023-04-30 MED ORDER — LIDOCAINE HCL (PF) 2 % IJ SOLN
INTRAMUSCULAR | Status: AC
  Filled 2023-04-30: qty 5

## 2023-04-30 MED ORDER — FENTANYL CITRATE (PF) 100 MCG/2ML IJ SOLN
INTRAMUSCULAR | Status: AC
Start: 2023-04-30 — End: ?
  Filled 2023-04-30: qty 2

## 2023-04-30 MED ORDER — ACETAMINOPHEN 10 MG/ML IV SOLN
INTRAVENOUS | Status: AC
  Filled 2023-04-30: qty 100

## 2023-04-30 MED ORDER — ROCURONIUM BROMIDE 100 MG/10ML IV SOLN
INTRAVENOUS | Status: DC | PRN
  Administered 2023-04-30: 60 mg via INTRAVENOUS

## 2023-04-30 MED ORDER — KETOROLAC TROMETHAMINE 30 MG/ML IJ SOLN
INTRAMUSCULAR | Status: DC | PRN
  Administered 2023-04-30: 15 mg via INTRAVENOUS

## 2023-04-30 MED ORDER — FENTANYL CITRATE (PF) 100 MCG/2ML IJ SOLN
25.0000 ug | INTRAMUSCULAR | Status: DC | PRN
  Administered 2023-04-30 (×2): 25 ug via INTRAVENOUS

## 2023-04-30 MED ORDER — SUGAMMADEX SODIUM 200 MG/2ML IV SOLN
INTRAVENOUS | Status: DC | PRN
  Administered 2023-04-30: 160 mg via INTRAVENOUS

## 2023-04-30 MED ORDER — EPHEDRINE 5 MG/ML INJ
INTRAVENOUS | Status: AC
  Filled 2023-04-30: qty 5

## 2023-04-30 MED ORDER — CEFAZOLIN SODIUM-DEXTROSE 2-4 GM/100ML-% IV SOLN
INTRAVENOUS | Status: AC
  Filled 2023-04-30: qty 100

## 2023-04-30 MED ORDER — SUGAMMADEX SODIUM 200 MG/2ML IV SOLN
INTRAVENOUS | Status: AC
  Filled 2023-04-30: qty 2

## 2023-04-30 MED ORDER — EPHEDRINE SULFATE-NACL 50-0.9 MG/10ML-% IV SOSY
PREFILLED_SYRINGE | INTRAVENOUS | Status: DC | PRN
  Administered 2023-04-30: 5 mg via INTRAVENOUS

## 2023-04-30 MED ORDER — ONDANSETRON HCL 4 MG PO TABS: 4.0000 mg | ORAL_TABLET | Freq: Three times a day (TID) | ORAL | 0 refills | Status: DC | PRN

## 2023-04-30 MED ORDER — HYDROCODONE-ACETAMINOPHEN 5-325 MG PO TABS: 1.0000 | ORAL_TABLET | Freq: Four times a day (QID) | ORAL | 0 refills | Status: AC | PRN

## 2023-04-30 SURGICAL SUPPLY — 43 items
BAG PRESSURE INF REUSE 1000 (BAG) IMPLANT
COVER TIP SHEARS 8 DVNC (MISCELLANEOUS) ×1 IMPLANT
COVER WAND RF STERILE (DRAPES) ×1 IMPLANT
DERMABOND ADVANCED .7 DNX12 (GAUZE/BANDAGES/DRESSINGS) ×1 IMPLANT
DRAPE ARM DVNC X/XI (DISPOSABLE) ×3 IMPLANT
DRAPE COLUMN DVNC XI (DISPOSABLE) ×1 IMPLANT
ELECT REM PT RETURN 9FT ADLT (ELECTROSURGICAL) ×1 IMPLANT
ELECTRODE REM PT RTRN 9FT ADLT (ELECTROSURGICAL) ×1 IMPLANT
FORCEPS BPLR FENES DVNC XI (FORCEP) ×1 IMPLANT
GLOVE BIO SURGEON STRL SZ 6.5 (GLOVE) ×2 IMPLANT
GLOVE BIOGEL PI IND STRL 6.5 (GLOVE) ×2 IMPLANT
GOWN STRL REUS W/ TWL LRG LVL3 (GOWN DISPOSABLE) ×3 IMPLANT
IRRIGATOR SUCT 8 DISP DVNC XI (IRRIGATION / IRRIGATOR) IMPLANT
IV CATH ANGIO 12GX3 LT BLUE (NEEDLE) IMPLANT
IV NS 1000ML BAXH (IV SOLUTION) IMPLANT
KIT PINK PAD W/HEAD ARE REST (MISCELLANEOUS) ×1 IMPLANT
KIT PINK PAD W/HEAD ARM REST (MISCELLANEOUS) ×1 IMPLANT
LABEL OR SOLS (LABEL) IMPLANT
MANIFOLD NEPTUNE II (INSTRUMENTS) ×1 IMPLANT
MESH 3DMAX MID 5X7 LT XLRG (Mesh General) IMPLANT
MESH 3DMAX MID 5X7 RT XLRG (Mesh General) IMPLANT
NDL DRIVE SUT CUT DVNC (INSTRUMENTS) ×1 IMPLANT
NDL HYPO 22X1.5 SAFETY MO (MISCELLANEOUS) ×1 IMPLANT
NDL INSUFFLATION 14GA 120MM (NEEDLE) ×1 IMPLANT
NEEDLE DRIVE SUT CUT DVNC (INSTRUMENTS) ×1 IMPLANT
NEEDLE HYPO 22X1.5 SAFETY MO (MISCELLANEOUS) ×1 IMPLANT
NEEDLE INSUFFLATION 14GA 120MM (NEEDLE) ×1 IMPLANT
OBTURATOR OPTICAL STND 8 DVNC (TROCAR) ×1 IMPLANT
OBTURATOR OPTICALSTD 8 DVNC (TROCAR) ×1 IMPLANT
PACK LAP CHOLECYSTECTOMY (MISCELLANEOUS) ×1 IMPLANT
SCISSORS MNPLR CVD DVNC XI (INSTRUMENTS) ×1 IMPLANT
SEAL UNIV 5-12 XI (MISCELLANEOUS) ×3 IMPLANT
SET TUBE SMOKE EVAC HIGH FLOW (TUBING) ×1 IMPLANT
SOL ELECTROSURG ANTI STICK (MISCELLANEOUS) ×1 IMPLANT
SOLUTION ELECTROSURG ANTI STCK (MISCELLANEOUS) ×1 IMPLANT
SUT MNCRL 4-0 27 PS-2 XMFL (SUTURE) ×1 IMPLANT
SUT STRATA 2-0 23CM CT-2 (SUTURE) ×1 IMPLANT
SUT VIC AB 2-0 SH 27XBRD (SUTURE) ×1 IMPLANT
SUTURE MNCRL 4-0 27XMF (SUTURE) ×1 IMPLANT
TAPE TRANSPORE STRL 2 31045 (GAUZE/BANDAGES/DRESSINGS) IMPLANT
TRAP FLUID SMOKE EVACUATOR (MISCELLANEOUS) ×1 IMPLANT
TRAY FOLEY MTR SLVR 16FR STAT (SET/KITS/TRAYS/PACK) ×1 IMPLANT
WATER STERILE IRR 500ML POUR (IV SOLUTION) ×1 IMPLANT

## 2023-04-30 NOTE — Interval H&P Note (Signed)
 History and Physical Interval Note:  04/30/2023 9:32 AM  Eric Boyer  has presented today for surgery, with the diagnosis of K40.90 non recurrent unilateral inguinal hernia w/o obstruction or gangrene.  The various methods of treatment have been discussed with the patient and family. After consideration of risks, benefits and other options for treatment, the patient has consented to  Procedure(s) with comments: REPAIR, HERNIA, INGUINAL, ROBOT-ASSISTED, LAPAROSCOPIC, USING MESH (Right) - POSSIBLE BILATERAL as a surgical intervention.  The patient's history has been reviewed, patient examined, no change in status, stable for surgery.  I have reviewed the patient's chart and labs.  Questions were answered to the patient's satisfaction.     Carolan Shiver

## 2023-04-30 NOTE — Op Note (Signed)
 Preoperative diagnosis: Bilateral inguinal hernia.   Postoperative diagnosis: Bilateral inguinal hernia.  Procedure: Robotic assisted Laparoscopic Transabdominal preperitoneal laparoscopic (TAPP) repair of bilateral inguinal hernia.  Anesthesia: GETA  Surgeon: Dr. Hazle Quant  Wound Classification: Clean  Indications:  Patient is a 73 y.o. male developed a symptomatic bilateral inguinal hernia. Repair was indicated.  Findings: 1. Bilateral indirect Inguinal hernia identified 2. Vas deferens and cord structures identified and preserved 3. Bard Extra Large 3D Max MID Anatomical mesh used for repair 4. Adequate hemostasis.   Description of procedure:  The patient was taken to the operating room and the correct side of surgery was verified. The patient was placed supine with right arm tucked at the side. After obtaining adequate anesthesia, the patient's abdomen was prepped and draped in standard sterile fashion. A time-out was completed verifying correct patient, procedure, site, positioning, and implant(s) and/or special equipment prior to beginning this procedure.  An incision was made in a natural skin line above the umbilicus. The fascia was elevated and the Veress needle inserted. Proper position was confirmed by aspiration and saline meniscus test.  The abdomen was insufflated with carbon dioxide to a pressure of 15 mmHg. The patient tolerated insufflation well.  Abdominal cavity was entered using Optiview technique with a millimeter trocar.  No injury was identified.  Another 2 mm trocars were placed lateral to each rectus muscle.  Scissors and bipolar forceps were inserted under direct visualization. At the robotic console both hernias fixed the following technique: Transverse peritoneal incision is made about 8 cm superior to the inguinal defect. Medial to the epigastric vessels, the parietal compartment is dissected to visualize the rectus muscle. This is carried down to the symphysis  pubis and the retropubic space is dissected to expose at least 2 cm contralateral to the midline. Cooper's ligament is exposed and cleared at least 2 cm below the ligament to ensure adequate space for the inferior border of the mesh. Hesselbach's triangle is cleared assessing for a direct hernia. Lateral to the epigastric vessels, the dissection is carried out in visceral compartment continuing in the true preperitoneal plane. Indirect hernia sac, was carefully reduced and separated from the cord structures with medial retraction and a combination of blunt/sharp dissection and focused cautery. This dissection was continued until the cord structures are "parietalized" completely, allowing for visualization of the reflected peritoneum that is continuous with the line originating 2 cm below Coopers medially and across the psoas muscle in the lateral compartment.  The internal ring was interrogated for a cord lipoma. The cord lipoma was reduced to the retroperitoneum and seated dorsal to the preperitoneal mesh. Having achieved a complete dissection with a critical view of the entire myopectineal orifice on both sides, a right and left XL mesh was then positioned centered at the iliopubic tract with the medial side crossing the midline and the inferior edge positioned 2 cm below Coopers ligament. The lateral aspect of the mesh extended 3-5 cm beyond the lateral edge of the psoas. The mesh is fixated using an interrupted suture placed to the ipsilateral Coopers ligament. A second suture was done at the medial superior aspect of the mesh fixating this to the rectus complex.  The peritoneal flap is closed with running barbed suture. Additional holes in the peritoneum closed with suture. Preperitoneal space gas aspirated to visualize the peritoneum apposed directly against the mesh and ensure no folding, lifting, or buckling of the mesh. Skin is closed, sterile dressings are applied.  The patient tolerated the  procedure  well and was taken to the postanesthesia care unit in stable condition  Specimen: None  Complications: None  Estimated Blood Loss: 5 mL

## 2023-04-30 NOTE — Discharge Instructions (Addendum)

## 2023-04-30 NOTE — Transfer of Care (Signed)
 Immediate Anesthesia Transfer of Care Note  Patient: CANDEN CIESLINSKI  Procedure(s) Performed: REPAIR, HERNIA, INGUINAL, ROBOT-ASSISTED, LAPAROSCOPIC, USING MESH (Bilateral)  Patient Location: PACU  Anesthesia Type:General  Level of Consciousness: awake, alert , and drowsy  Airway & Oxygen Therapy: Patient Spontanous Breathing  Post-op Assessment: Report given to RN and Post -op Vital signs reviewed and stable  Post vital signs: Reviewed and stable  Last Vitals:  Vitals Value Taken Time  BP 148/79 04/30/23 1133  Temp 35.8 1133  Pulse 93 04/30/23 1136  Resp 22 04/30/23 1136  SpO2 95 % 04/30/23 1136  Vitals shown include unfiled device data.  Last Pain:  Vitals:   04/30/23 0816  TempSrc: Temporal  PainSc: 0-No pain      Patients Stated Pain Goal: 0 (04/30/23 0816)  Complications: No notable events documented.

## 2023-04-30 NOTE — Anesthesia Procedure Notes (Signed)
 Procedure Name: Intubation Date/Time: 04/30/2023 10:02 AM  Performed by: Morene Crocker, CRNAPre-anesthesia Checklist: Patient identified, Patient being monitored, Timeout performed, Emergency Drugs available and Suction available Patient Re-evaluated:Patient Re-evaluated prior to induction Oxygen Delivery Method: Circle system utilized Preoxygenation: Pre-oxygenation with 100% oxygen Induction Type: IV induction Ventilation: Mask ventilation without difficulty Laryngoscope Size: 3 and McGrath Grade View: Grade I Tube type: Oral Tube size: 7.0 mm Number of attempts: 1 Airway Equipment and Method: Stylet Placement Confirmation: ETT inserted through vocal cords under direct vision, positive ETCO2 and breath sounds checked- equal and bilateral Secured at: 22 cm Tube secured with: Tape Dental Injury: Teeth and Oropharynx as per pre-operative assessment  Comments: Smooth, atraumatic intubation, no complications noted.

## 2023-04-30 NOTE — Anesthesia Postprocedure Evaluation (Signed)
 Anesthesia Post Note  Patient: Eric Boyer  Procedure(s) Performed: REPAIR, HERNIA, INGUINAL, ROBOT-ASSISTED, LAPAROSCOPIC, USING MESH (Bilateral)  Patient location during evaluation: PACU Anesthesia Type: General Level of consciousness: awake and alert Pain management: pain level controlled Vital Signs Assessment: post-procedure vital signs reviewed and stable Respiratory status: spontaneous breathing, nonlabored ventilation, respiratory function stable and patient connected to nasal cannula oxygen Cardiovascular status: blood pressure returned to baseline and stable Postop Assessment: no apparent nausea or vomiting Anesthetic complications: no   No notable events documented.   Last Vitals:  Vitals:   04/30/23 1215 04/30/23 1229  BP: 114/68 117/74  Pulse: 84 81  Resp: (!) 26 (!) 24  Temp: 37.2 C 37.1 C  SpO2: 95% 96%    Last Pain:  Vitals:   04/30/23 1229  TempSrc: Temporal  PainSc: 4                  Cleda Mccreedy Eric Boyer

## 2023-04-30 NOTE — Anesthesia Preprocedure Evaluation (Signed)
 Anesthesia Evaluation  Patient identified by MRN, date of birth, ID band Patient awake    Reviewed: Allergy & Precautions, NPO status , Patient's Chart, lab work & pertinent test results  History of Anesthesia Complications (+) PONV and history of anesthetic complications  Airway Mallampati: III  TM Distance: >3 FB Neck ROM: full    Dental  (+) Missing   Pulmonary shortness of breath and with exertion, asthma , COPD, former smoker   Pulmonary exam normal        Cardiovascular Exercise Tolerance: Good (-) angina (-) Past MI and (-) DOE Normal cardiovascular exam     Neuro/Psych negative neurological ROS  negative psych ROS   GI/Hepatic Neg liver ROS,GERD  Controlled,,  Endo/Other  negative endocrine ROS    Renal/GU      Musculoskeletal   Abdominal   Peds  Hematology negative hematology ROS (+)   Anesthesia Other Findings Past Medical History: No date: Asthma No date: BPH with obstruction/lower urinary tract symptoms No date: Complication of anesthesia No date: COPD (chronic obstructive pulmonary disease) (HCC) No date: Gastroesophageal reflux disease, unspecified whether  esophagitis present No date: Hyperlipidemia No date: Major depressive disorder, recurrent, mild (HCC) No date: Non-recurrent unilateral inguinal hernia without obstruction  or gangrene No date: Osteoarthritis No date: PONV (postoperative nausea and vomiting) No date: Right knee pain, unspecified chronicity  Past Surgical History: 2018: brain tumor surgery No date: CATARACT EXTRACTION No date: COLONOSCOPY 10/02/2014: COLONOSCOPY WITH PROPOFOL; N/A     Comment:  Procedure: COLONOSCOPY WITH PROPOFOL;  Surgeon: Scot Jun, MD;  Location: [facility] ENDOSCOPY;  Service:               Endoscopy;  Laterality: N/A; No date: ESOPHAGOGASTRODUODENOSCOPY No date: NOSE SURGERY No date: RETINAL DETACHMENT SURGERY  BMI    Body Mass  Index: 22.38 kg/m      Reproductive/Obstetrics negative OB ROS                             Anesthesia Physical Anesthesia Plan  ASA: 3  Anesthesia Plan: General ETT   Post-op Pain Management:    Induction: Intravenous  PONV Risk Score and Plan: Ondansetron, Dexamethasone, Midazolam and Treatment may vary due to age or medical condition  Airway Management Planned: Oral ETT  Additional Equipment:   Intra-op Plan:   Post-operative Plan: Extubation in OR  Informed Consent: I have reviewed the patients History and Physical, chart, labs and discussed the procedure including the risks, benefits and alternatives for the proposed anesthesia with the patient or authorized representative who has indicated his/her understanding and acceptance.     Dental Advisory Given  Plan Discussed with: Anesthesiologist, CRNA and Surgeon  Anesthesia Plan Comments: (Patient consented for risks of anesthesia including but not limited to:  - adverse reactions to medications - damage to eyes, teeth, lips or other oral mucosa - nerve damage due to positioning  - sore throat or hoarseness - Damage to heart, brain, nerves, lungs, other parts of body or loss of life  Patient voiced understanding and assent.)       Anesthesia Quick Evaluation

## 2023-05-01 ENCOUNTER — Encounter: Payer: Self-pay | Admitting: General Surgery

## 2023-11-16 ENCOUNTER — Other Ambulatory Visit: Payer: Self-pay | Admitting: Internal Medicine

## 2023-11-16 DIAGNOSIS — Z0000 Encounter for general adult medical examination without abnormal findings: Secondary | ICD-10-CM

## 2023-11-16 DIAGNOSIS — R10813 Right lower quadrant abdominal tenderness: Secondary | ICD-10-CM

## 2023-11-30 ENCOUNTER — Ambulatory Visit
Admission: RE | Admit: 2023-11-30 | Discharge: 2023-11-30 | Disposition: A | Discharge status: Home / Self Care | Source: Ambulatory Visit | Attending: Internal Medicine | Admitting: Internal Medicine

## 2023-11-30 DIAGNOSIS — R10813 Right lower quadrant abdominal tenderness: Secondary | ICD-10-CM | POA: Insufficient documentation

## 2023-11-30 DIAGNOSIS — Z0000 Encounter for general adult medical examination without abnormal findings: Secondary | ICD-10-CM | POA: Insufficient documentation

## 2023-11-30 MED ORDER — IOHEXOL 300 MG/ML  SOLN
100.0000 mL | Freq: Once | INTRAMUSCULAR | Status: AC | PRN
  Administered 2023-11-30: 100 mL via INTRAVENOUS

## 2023-12-20 NOTE — H&P (View-Only) (Signed)
 History of Present Illness Eric Boyer is a 73 year old male who presents for evaluation of a right lumbar incisional hernia.  He has a right lumbar incisional hernia on his back, confirmed by a CT scan of the abdomen and pelvis on November 30, 2023, showing a right lumbar hernia with retroperitoneal fat. He experiences intermittent back pain, rated as a 4/10t, which has been present for over six months.  He has a history of back surgery in 2021 for spinal fusion and decompression, during which spacers were placed. He previously underwent surgery for an inguinal hernia, which is a different condition from the current lumbar hernia.  His sister advised him to have a CT scan, which led to the discovery of the hernia. I personally evaluated the images. He is concerned about the recurrence of hernias and questions whether the previous surgery was complete.      PAST MEDICAL HISTORY:  Past Medical History:  Diagnosis Date  . Abdominal pain, LLQ (left lower quadrant) 10/21/2014  . Asthma, unspecified asthma severity, unspecified whether complicated, unspecified whether persistent (HHS-HCC)   . Benign neoplasm of cranial nerves (CMS/HHS-HCC)   . Cardiomegaly   . COPD (chronic obstructive pulmonary disease) (CMS/HHS-HCC)   . Enlarged prostate   . GERD (gastroesophageal reflux disease)   . Hyperlipidemia   . Low back pain   . Nontoxic goiter    PATIENT denies  . Osteoarthritis   . Osteoporosis   . Pain of upper abdomen 10/21/2014  . Pancreatic abnormality 10/21/2014  . Vitamin D deficiency         PAST SURGICAL HISTORY:   Past Surgical History:  Procedure Laterality Date  . COLONOSCOPY  05/28/2003   [facility] (Mother)  . UPPER GASTROINTESTINAL ENDOSCOPY  05/28/2003   No repeat per RTE  . COLONOSCOPY  10/02/2014   [facility] (Mother): CBF 09/2019  . OBLIQUE LATERAL INTERBODY FUSION LUMBAR N/A 03/17/2019   Procedure: L4-5 XLIF/POSTERIOR SPINAL FUSION AND DECOMPRESSION;  Surgeon: Clois Reeves Norway, MD;  Location: [facility] OR;  Service: Neurosurgery;  Laterality: N/A;  . INSERTION INTERBODY BIOMECHANICAL DEVICE W/ INSTRUMENTATION N/A 03/17/2019   Procedure: INSERTION INTERBODY BIOMECHANICAL DEVICE WITH INTEGRAL ANTERIOR INSTRUMENTATION, TO INTERVERTEBRAL DISC SPACE IN CONJUNCTION INTERBODY ARTHRODESIS, EACH INTERSPACE;  Surgeon: Clois Reeves Norway, MD;  Location: [facility] OR;  Service: Neurosurgery;  Laterality: N/A;  . PERCUTANEOUS SPINAL FUSION N/A 03/17/2019   Procedure: ARTHRODESIS, POSTERIOR OR POSTEROLATERAL TECHNIQUE, SINGLE LEVEL; LUMBAR (WITH LATERAL TRANSVERSE TECHNIQUE, WHEN PERFORMED);  Surgeon: Clois Reeves Norway, MD;  Location: [facility] OR;  Service: Neurosurgery;  Laterality: N/A;  . INSTRUMENTATION NON-SEGMENTAL POSTERIOR SPINE N/A 03/17/2019   Procedure: INSTRUMENTATION POSTERIOR SPINE 1/2 VERTEBRAL SEGMENTS W/O FIXATION ADDITIONAL FIFTH;  Surgeon: Clois Reeves Norway, MD;  Location: [facility] OR;  Service: Neurosurgery;  Laterality: N/A;  . INTRAOPERATIVE FLUOROSCOPY N/A 03/17/2019   Procedure: FLUOROSCOPY;  Surgeon: Clois Reeves Norway, MD;  Location: [facility] OR;  Service: Neurosurgery;  Laterality: N/A;  . COLONOSCOPY  04/06/2020   Tubular adenomas/FHx CC/Repeat 51yrs/TKT  . ROBOT ASSISTED LAPAROSCOPIC REPAIR INGUINAL HERNIA Bilateral 04/30/2023   Dr Lucas Catchings  . brain tumor surgery    . CATARACT EXTRACTION    . HEMORROIDECTOMY  1980's  . nose surgery    . retinal detatchment           MEDICATIONS:  Outpatient Encounter Medications as of 12/20/2023  Medication Sig Dispense Refill  . acetaminophen  (TYLENOL ) 500 MG tablet Take 1,000 mg by mouth every 8 (eight) hours as needed for  Pain       . albuterol  MDI, PROVENTIL , VENTOLIN , PROAIR , HFA 90 mcg/actuation inhaler Inhale 2 inhalations into the lungs every 6 (six) hours as needed for Wheezing 1 each 3  . atorvastatin  (LIPITOR) 20 MG tablet TAKE 1 TABLET BY MOUTH AT BEDTIME 100 tablet 0  .  calcium  carbonate 500 mg calcium  (1,250 mg) chewable tablet Take 1 tablet by mouth once daily    . cholecalciferol , vitamin D3, (VITAMIN D3) 125 mcg (5,000 unit) tablet Take 1 tablet by mouth every morning.      . citalopram  (CELEXA ) 10 MG tablet Take 1 tablet (10 mg total) by mouth once daily 90 tablet 1  . dextromethorphan polistirex (DELSYM) 30 mg/5 mL liquid Take by mouth 2 (two) times daily OTC    . fluticasone furoate-vilanteroL (BREO ELLIPTA) 100-25 mcg/dose DsDv inhaler INHALE 1 PUFF INTO THE LUNGS ONCE DAILY 60 each 0  . predniSONE (DELTASONE) 20 MG tablet 3qam for 5 days, 2qam for 5 days, 1qam for 5 days 30 tablet 0  . propylene glycoL (SYSTANE BALANCE) 0.6 % ophthalmic drops Apply 1 drop to eye 2 (two) times daily    . SPIRIVA  WITH HANDIHALER 18 mcg inhalation capsule PLACE 1 CAPSULE INTO INHALER AND INHALE BY MOUTH ONCE DAILY 90 capsule 0  . tamsulosin (FLOMAX) 0.4 mg capsule Take 1 capsule (0.4 mg total) by mouth once daily Take 30 minutes after same meal each day. 30 capsule 11  . peg 400-propylene glycol, PF, (SYSTANE ULTRA) 0.4-0.3 % ophthalmic drops Place 1 drop into both eyes as needed for Dry Eyes (night) (Patient not taking: Reported on 12/20/2023)     No facility-administered encounter medications on file as of 12/20/2023.     ALLERGIES:   Patient has no known allergies.   SOCIAL HISTORY:  Social History   Socioeconomic History  . Marital status: Single  Tobacco Use  . Smoking status: Former    Current packs/day: 0.00    Types: Cigarettes    Quit date: 08/24/1968    Years since quitting: 55.3  . Smokeless tobacco: Never  Vaping Use  . Vaping status: Former  Substance and Sexual Activity  . Alcohol use: Never    Comment: Quit ETOH 8-9 years ago. Beer 1 case beer twice per week  . Drug use: No  . Sexual activity: Never   Social Drivers of Corporate Investment Banker Strain: Low Risk  (04/24/2023)   Overall Financial Resource Strain (CARDIA)   . Difficulty of  Paying Living Expenses: Not very hard  Food Insecurity: No Food Insecurity (04/24/2023)   Hunger Vital Sign   . Worried About Programme Researcher, Broadcasting/film/video in the Last Year: Never true   . Ran Out of Food in the Last Year: Never true  Transportation Needs: No Transportation Needs (04/24/2023)   PRAPARE - Transportation   . Lack of Transportation (Medical): No   . Lack of Transportation (Non-Medical): No    FAMILY HISTORY:  Family History  Problem Relation Name Age of Onset  . Cancer Brother    . Colon cancer Mother       GENERAL REVIEW OF SYSTEMS:   General ROS: negative for - chills, fatigue, fever, weight gain or weight loss Allergy and Immunology ROS: negative for - hives  Hematological and Lymphatic ROS: negative for - bleeding problems or bruising, negative for palpable nodes Endocrine ROS: negative for - heat or cold intolerance, hair changes Respiratory ROS: negative for - cough, shortness of breath or wheezing  Cardiovascular ROS: no chest pain or palpitations GI ROS: negative for nausea, vomiting, abdominal pain, diarrhea, constipation Musculoskeletal ROS: negative for - joint swelling or muscle pain Neurological ROS: negative for - confusion, syncope Dermatological ROS: negative for pruritus and rash  PHYSICAL EXAM:  Vitals:   12/20/23 0917  BP: 128/80  Pulse: 90  .  Ht:182.9 cm (6') Wt:72.6 kg (160 lb) ADJ:Anib surface area is 1.92 meters squared. Body mass index is 21.7 kg/m.SABRA   GENERAL: Alert, active, oriented x3  HEENT: Pupils equal reactive to light. Extraocular movements are intact. Sclera clear. Palpebral conjunctiva normal red color.Pharynx clear.  NECK: Supple with no palpable mass and no adenopathy.  LUNGS: Sound clear with no rales rhonchi or wheezes.  HEART: Regular rhythm S1 and S2 without murmur.  ABDOMEN: Soft and depressible, nontender with no palpable mass, no hepatomegaly.   BACK: Reducible, but painful hernia on the right lower quadrant of the  back. Visible scar on top of the hernia  EXTREMITIES: Well-developed well-nourished symmetrical with no dependent edema.  NEUROLOGICAL: Awake alert oriented, facial expression symmetrical, moving all extremities.   Results RADIOLOGY CT scan of the abdomen and pelvis: Right lumbar hernia with retroperitoneal fat (11/30/2023)    Assessment & Plan Right lumbar incisional hernia   He has experienced intermittent back pain for over six months due to a right lumbar incisional hernia. A CT scan on November 30, 2023, confirmed the presence of the hernia with retroperitoneal fat. This new hernia is likely related to his spinal surgery in 2021. Although there is no current pain at the hernia site, discomfort persists. Surgical repair is scheduled as laparoscopic surgery using a small incision similar to previous hernia repairs, with same-day discharge expected.  We discussed about risks of surgery including bleeding, infection, injury to adjacent organs, bowel perforation, kidney injury, ureter injury, chronic pain, among others.  The patient reported he understood and agreed to proceed.  No Foley catheter will be needed postoperatively. Postoperative pain medication will be prescribed. Surgery date and time will be confirmed with him.   Lumbar hernia [K45.8]          Patient verbalized understanding, all questions were answered, and were agreeable with the plan outlined above.   Lucas Sjogren, MD  Electronically signed by Lucas Sjogren, MD

## 2023-12-21 ENCOUNTER — Ambulatory Visit: Payer: Self-pay | Admitting: General Surgery

## 2024-01-02 ENCOUNTER — Other Ambulatory Visit: Payer: Self-pay

## 2024-01-02 ENCOUNTER — Encounter
Admission: RE | Admit: 2024-01-02 | Discharge: 2024-01-02 | Disposition: A | Discharge status: Home / Self Care | Source: Ambulatory Visit | Attending: General Surgery | Admitting: General Surgery

## 2024-01-02 VITALS — Ht 71.0 in | Wt 162.0 lb

## 2024-01-02 DIAGNOSIS — Z01810 Encounter for preprocedural cardiovascular examination: Secondary | ICD-10-CM

## 2024-01-02 DIAGNOSIS — K458 Other specified abdominal hernia without obstruction or gangrene: Secondary | ICD-10-CM

## 2024-01-02 DIAGNOSIS — I10 Essential (primary) hypertension: Secondary | ICD-10-CM

## 2024-01-02 HISTORY — DX: Other specified abdominal hernia without obstruction or gangrene: K45.8

## 2024-01-02 HISTORY — DX: Dyspnea, unspecified: R06.00

## 2024-01-02 HISTORY — DX: Personal history of urinary calculi: Z87.442

## 2024-01-02 NOTE — Patient Instructions (Addendum)
 Your procedure is scheduled on: Wednesday 01/10/24 Report to the Registration Desk on the 1st floor of the [facility]. To find out your arrival time, please call [phone] between 1PM - 3PM on: Tuesday 01/09/24 If your arrival time is 6:00 am, do not arrive before that time as the [facility] entrance doors do not open until 6:00 am.  REMEMBER: Instructions that are not followed completely may result in serious medical risk, up to and including death; or upon the discretion of your surgeon and anesthesiologist your surgery may need to be rescheduled.  Do not eat food or drink fluids after midnight the night before surgery.  No gum chewing or hard candies.  One week prior to surgery: Stop Anti-inflammatories (NSAIDS) such as Advil, Aleve, Ibuprofen, Motrin, Naproxen, Naprosyn and Aspirin based products such as Excedrin, Goody's Powder, BC Powder. Stop ANY OVER THE COUNTER supplements until after surgery.  You may however, continue to take Tylenol  if needed for pain up until the day of surgery.   Continue taking all of your other prescription medications up until the day of surgery.  ON THE DAY OF SURGERY ONLY TAKE THESE MEDICATIONS WITH SIPS OF WATER:  tamsulosin (FLOMAX) 0.4 MG  citalopram  (CELEXA ) 10 MG   Use inhalers on the day of surgery and bring to the hospital. albuterol  (PROVENTIL  HFA;VENTOLIN  HFA) 108 (90 BASE) MCG/ACT inhaler    No Alcohol for 24 hours before or after surgery.  No Smoking including e-cigarettes for 24 hours before surgery.  No chewable tobacco products for at least 6 hours before surgery.  No nicotine patches on the day of surgery.  Do not use any recreational drugs for at least a week (preferably 2 weeks) before your surgery.  Please be advised that the combination of cocaine and anesthesia may have negative outcomes, up to and including death. If you test positive for cocaine, your surgery will be cancelled.  On the morning of surgery brush  your teeth with toothpaste and water, you may rinse your mouth with mouthwash if you wish. Do not swallow any toothpaste or mouthwash.  Use CHG Soap or wipes as directed on instruction sheet.  Do not wear jewelry, make-up, hairpins, clips or nail polish.  For welded (permanent) jewelry: bracelets, anklets, waist bands, etc.  Please have this removed prior to surgery.  If it is not removed, there is a chance that hospital personnel will need to cut it off on the day of surgery.  Do not wear lotions, powders, or perfumes.   Do not shave body hair from the neck down 48 hours before surgery.  Contact lenses, hearing aids and dentures may not be worn into surgery.  Do not bring valuables to the hospital. [facility] is not responsible for any missing/lost belongings or valuables.   Notify your doctor if there is any change in your medical condition (cold, fever, infection).  Wear comfortable clothing (specific to your surgery type) to the hospital.  After surgery, you can help prevent lung complications by doing breathing exercises.  Take deep breaths and cough every 1-2 hours. Your doctor may order a device called an Incentive Spirometer to help you take deep breaths. When coughing or sneezing, hold a pillow firmly against your incision with both hands. This is called "splinting." Doing this helps protect your incision. It also decreases belly discomfort.  If you are being admitted to the hospital overnight, leave your suitcase in the car. After surgery it may be brought to your room.  In case of increased patient census, it may be necessary for you, the patient, to continue your postoperative care in the Same Day Surgery department.  If you are being discharged the day of surgery, you will not be allowed to drive home. You will need a responsible individual to drive you home and stay with you for 24 hours after surgery.   If you are taking public transportation, you will need to have a  responsible individual with you.  Please call the Pre-admissions Testing Dept. at [phone] if you have any questions about these instructions.  Surgery Visitation Policy:  Patients having surgery or a procedure may have two visitors.  Children under the age of 48 must have an adult with them who is not the patient.  Inpatient Visitation:    Visiting hours are 7 a.m. to 8 p.m. Up to four visitors are allowed at one time in a patient room. The visitors may rotate out with other people during the day.  One visitor age 41 or older may stay with the patient overnight and must be in the room by 8 p.m.   Merchandiser, Retail to address health-related social needs:  [email or web address]                                                                                                            Preparing for Surgery with CHLORHEXIDINE  GLUCONATE (CHG) Soap  Chlorhexidine  Gluconate (CHG) Soap  o An antiseptic cleaner that kills germs and bonds with the skin to continue killing germs even after washing  o Used for showering the night before surgery and morning of surgery  Before surgery, you can play an important role by reducing the number of germs on your skin.  CHG (Chlorhexidine  gluconate) soap is an antiseptic cleanser which kills germs and bonds with the skin to continue killing germs even after washing.  Please do not use if you have an allergy to CHG or antibacterial soaps. If your skin becomes reddened/irritated stop using the CHG.  1. Shower the NIGHT BEFORE SURGERY with CHG soap.  2. If you choose to wash your hair, wash your hair first as usual with your normal shampoo.  3. After shampooing, rinse your hair and body thoroughly to remove the shampoo.  4. Use CHG as you would any other liquid soap. You can apply CHG directly to the skin and wash gently with a clean washcloth.  5. Apply the CHG soap to your body only from the neck down. Do not use on open  wounds or open sores. Avoid contact with your eyes, ears, mouth, and genitals (private parts). Wash face and genitals (private parts) with your normal soap.  6. Wash thoroughly, paying special attention to the area where your surgery will be performed.  7. Thoroughly rinse your body with warm water.  8. Do not shower/wash with your normal soap after using and rinsing off the CHG soap.  9. Do not use lotions, oils, etc., after showering with CHG.  10. Pat yourself dry with a clean towel.  11. Wear clean pajamas to bed the night before surgery.  12. Place clean sheets on your bed the night of your shower and do not sleep with pets.  13. Do not apply any deodorants/lotions/powders.  14. Please wear clean clothes to the hospital.  15. Remember to brush your teeth with your regular toothpaste.

## 2024-01-07 ENCOUNTER — Encounter
Admission: RE | Admit: 2024-01-07 | Discharge: 2024-01-07 | Disposition: A | Source: Ambulatory Visit | Attending: General Surgery

## 2024-01-07 DIAGNOSIS — I10 Essential (primary) hypertension: Secondary | ICD-10-CM

## 2024-01-07 DIAGNOSIS — I491 Atrial premature depolarization: Secondary | ICD-10-CM | POA: Diagnosis not present

## 2024-01-07 DIAGNOSIS — Z01810 Encounter for preprocedural cardiovascular examination: Secondary | ICD-10-CM | POA: Diagnosis not present

## 2024-01-07 DIAGNOSIS — K458 Other specified abdominal hernia without obstruction or gangrene: Secondary | ICD-10-CM | POA: Insufficient documentation

## 2024-01-08 MED ORDER — ORAL CARE MOUTH RINSE: 15.0000 mL | Freq: Once | OROMUCOSAL | Status: AC

## 2024-01-08 MED ORDER — CEFAZOLIN SODIUM-DEXTROSE 2-4 GM/100ML-% IV SOLN
2.0000 g | INTRAVENOUS | Status: AC
  Administered 2024-01-09: 2 g via INTRAVENOUS

## 2024-01-08 MED ORDER — CHLORHEXIDINE GLUCONATE 0.12 % MT SOLN
15.0000 mL | Freq: Once | OROMUCOSAL | Status: AC
  Administered 2024-01-09: 15 mL via OROMUCOSAL

## 2024-01-08 MED ORDER — LACTATED RINGERS IV SOLN: INTRAVENOUS | Status: DC

## 2024-01-09 ENCOUNTER — Encounter: Payer: Self-pay | Admitting: General Surgery

## 2024-01-09 ENCOUNTER — Other Ambulatory Visit: Payer: Self-pay

## 2024-01-09 ENCOUNTER — Ambulatory Visit

## 2024-01-09 ENCOUNTER — Ambulatory Visit
Admission: RE | Admit: 2024-01-09 | Discharge: 2024-01-09 | Disposition: A | Attending: General Surgery | Admitting: General Surgery

## 2024-01-09 ENCOUNTER — Encounter: Admission: RE | Disposition: A | Payer: Self-pay | Source: Home / Self Care | Attending: General Surgery

## 2024-01-09 DIAGNOSIS — K430 Incisional hernia with obstruction, without gangrene: Secondary | ICD-10-CM | POA: Insufficient documentation

## 2024-01-09 DIAGNOSIS — J4489 Other specified chronic obstructive pulmonary disease: Secondary | ICD-10-CM | POA: Insufficient documentation

## 2024-01-09 DIAGNOSIS — K219 Gastro-esophageal reflux disease without esophagitis: Secondary | ICD-10-CM | POA: Insufficient documentation

## 2024-01-09 DIAGNOSIS — Z87891 Personal history of nicotine dependence: Secondary | ICD-10-CM | POA: Diagnosis not present

## 2024-01-09 HISTORY — PX: INSERTION OF MESH: SHX5868

## 2024-01-09 SURGERY — REPAIR, HERNIA, FLANK, ROBOT-ASSISTED
Anesthesia: General | Laterality: Right

## 2024-01-09 MED ORDER — FENTANYL CITRATE (PF) 100 MCG/2ML IJ SOLN
INTRAMUSCULAR | Status: AC
  Filled 2024-01-09: qty 2

## 2024-01-09 MED ORDER — HYDROMORPHONE HCL 1 MG/ML IJ SOLN
INTRAMUSCULAR | Status: AC
  Filled 2024-01-09: qty 1

## 2024-01-09 MED ORDER — HYDROCODONE-ACETAMINOPHEN 5-325 MG PO TABS
1.0000 | ORAL_TABLET | Freq: Once | ORAL | Status: AC
  Administered 2024-01-09: 1 via ORAL

## 2024-01-09 MED ORDER — PHENYLEPHRINE HCL-NACL 20-0.9 MG/250ML-% IV SOLN
INTRAVENOUS | Status: AC
  Filled 2024-01-09: qty 250

## 2024-01-09 MED ORDER — HYDROMORPHONE HCL 1 MG/ML IJ SOLN
INTRAMUSCULAR | Status: DC | PRN
  Administered 2024-01-09 (×2): .5 mg via INTRAVENOUS

## 2024-01-09 MED ORDER — EPHEDRINE 5 MG/ML INJ
INTRAVENOUS | Status: AC
  Filled 2024-01-09: qty 5

## 2024-01-09 MED ORDER — HYDROCODONE-ACETAMINOPHEN 5-325 MG PO TABS
1.0000 | ORAL_TABLET | Freq: Four times a day (QID) | ORAL | 0 refills | Status: AC | PRN
  Filled 2024-01-09: qty 12, 3d supply, fill #0

## 2024-01-09 MED ORDER — MIDAZOLAM HCL 2 MG/2ML IJ SOLN
INTRAMUSCULAR | Status: AC
  Filled 2024-01-09: qty 2

## 2024-01-09 MED ORDER — BUPIVACAINE-EPINEPHRINE (PF) 0.25% -1:200000 IJ SOLN
INTRAMUSCULAR | Status: AC
  Filled 2024-01-09: qty 30

## 2024-01-09 MED ORDER — DROPERIDOL 2.5 MG/ML IJ SOLN: 0.6250 mg | Freq: Once | INTRAMUSCULAR | Status: DC | PRN

## 2024-01-09 MED ORDER — HYDROCODONE-ACETAMINOPHEN 5-325 MG PO TABS
ORAL_TABLET | ORAL | Status: AC
  Filled 2024-01-09: qty 1

## 2024-01-09 MED ORDER — ROCURONIUM BROMIDE 10 MG/ML (PF) SYRINGE
PREFILLED_SYRINGE | INTRAVENOUS | Status: AC
  Filled 2024-01-09: qty 10

## 2024-01-09 MED ORDER — CHLORHEXIDINE GLUCONATE 0.12 % MT SOLN
OROMUCOSAL | Status: AC
  Filled 2024-01-09: qty 15

## 2024-01-09 MED ORDER — SUGAMMADEX SODIUM 200 MG/2ML IV SOLN
INTRAVENOUS | Status: DC | PRN
  Administered 2024-01-09: 200 mg via INTRAVENOUS

## 2024-01-09 MED ORDER — BUPIVACAINE-EPINEPHRINE (PF) 0.25% -1:200000 IJ SOLN
INTRAMUSCULAR | Status: DC | PRN
  Administered 2024-01-09: 30 mL via PERINEURAL

## 2024-01-09 MED ORDER — ONDANSETRON HCL 4 MG PO TABS
4.0000 mg | ORAL_TABLET | Freq: Three times a day (TID) | ORAL | 0 refills | Status: AC | PRN
  Filled 2024-01-09: qty 10, 4d supply, fill #0

## 2024-01-09 MED ORDER — ONDANSETRON HCL 4 MG/2ML IJ SOLN
INTRAMUSCULAR | Status: DC | PRN
  Administered 2024-01-09: 4 mg via INTRAVENOUS

## 2024-01-09 MED ORDER — ROCURONIUM BROMIDE 100 MG/10ML IV SOLN
INTRAVENOUS | Status: DC | PRN
  Administered 2024-01-09 (×2): 10 mg via INTRAVENOUS
  Administered 2024-01-09: 50 mg via INTRAVENOUS

## 2024-01-09 MED ORDER — LIDOCAINE HCL (PF) 2 % IJ SOLN
INTRAMUSCULAR | Status: AC
  Filled 2024-01-09: qty 5

## 2024-01-09 MED ORDER — PHENYLEPHRINE 80 MCG/ML (10ML) SYRINGE FOR IV PUSH (FOR BLOOD PRESSURE SUPPORT)
PREFILLED_SYRINGE | INTRAVENOUS | Status: DC | PRN
  Administered 2024-01-09: 240 ug via INTRAVENOUS
  Administered 2024-01-09 (×3): 160 ug via INTRAVENOUS
  Administered 2024-01-09: 240 ug via INTRAVENOUS
  Administered 2024-01-09: 80 ug via INTRAVENOUS

## 2024-01-09 MED ORDER — EPHEDRINE SULFATE-NACL 50-0.9 MG/10ML-% IV SOSY
PREFILLED_SYRINGE | INTRAVENOUS | Status: DC | PRN
  Administered 2024-01-09: 10 mg via INTRAVENOUS

## 2024-01-09 MED ORDER — PHENYLEPHRINE HCL-NACL 20-0.9 MG/250ML-% IV SOLN
INTRAVENOUS | Status: DC | PRN
  Administered 2024-01-09: 80 ug/min via INTRAVENOUS

## 2024-01-09 MED ORDER — MIDAZOLAM HCL (PF) 2 MG/2ML IJ SOLN
INTRAMUSCULAR | Status: DC | PRN
  Administered 2024-01-09: 2 mg via INTRAVENOUS

## 2024-01-09 MED ORDER — FENTANYL CITRATE (PF) 100 MCG/2ML IJ SOLN
INTRAMUSCULAR | Status: DC | PRN
  Administered 2024-01-09: 100 ug via INTRAVENOUS

## 2024-01-09 MED ORDER — 0.9 % SODIUM CHLORIDE (POUR BTL) OPTIME
TOPICAL | Status: DC | PRN
  Administered 2024-01-09: 500 mL

## 2024-01-09 MED ORDER — DEXAMETHASONE SOD PHOSPHATE PF 10 MG/ML IJ SOLN
INTRAMUSCULAR | Status: DC | PRN
  Administered 2024-01-09: 10 mg via INTRAVENOUS

## 2024-01-09 MED ORDER — PROPOFOL 10 MG/ML IV BOLUS
INTRAVENOUS | Status: AC
  Filled 2024-01-09: qty 40

## 2024-01-09 MED ORDER — PROPOFOL 10 MG/ML IV BOLUS
INTRAVENOUS | Status: DC | PRN
  Administered 2024-01-09: 120 mg via INTRAVENOUS

## 2024-01-09 MED ORDER — FENTANYL CITRATE (PF) 100 MCG/2ML IJ SOLN
25.0000 ug | INTRAMUSCULAR | Status: DC | PRN
  Administered 2024-01-09: 25 ug via INTRAVENOUS

## 2024-01-09 MED ORDER — CEFAZOLIN SODIUM-DEXTROSE 2-4 GM/100ML-% IV SOLN
INTRAVENOUS | Status: AC
  Filled 2024-01-09: qty 100

## 2024-01-09 MED ORDER — PHENYLEPHRINE 80 MCG/ML (10ML) SYRINGE FOR IV PUSH (FOR BLOOD PRESSURE SUPPORT)
PREFILLED_SYRINGE | INTRAVENOUS | Status: AC
  Filled 2024-01-09: qty 20

## 2024-01-09 MED ORDER — ONDANSETRON HCL 4 MG/2ML IJ SOLN
INTRAMUSCULAR | Status: AC
  Filled 2024-01-09: qty 2

## 2024-01-09 MED ORDER — LIDOCAINE HCL (CARDIAC) PF 100 MG/5ML IV SOSY
PREFILLED_SYRINGE | INTRAVENOUS | Status: DC | PRN
  Administered 2024-01-09: 80 mg via INTRAVENOUS

## 2024-01-09 SURGICAL SUPPLY — 38 items
BAG PRESSURE INF REUSE 1000 (BAG) IMPLANT
CANNULA REDUCER 12-8 DVNC XI (CANNULA) IMPLANT
COVER TIP SHEARS 8 DVNC (MISCELLANEOUS) ×2 IMPLANT
COVER WAND RF STERILE (DRAPES) ×2 IMPLANT
DEFOGGER SCOPE WARM SEASHARP (MISCELLANEOUS) ×2 IMPLANT
DERMABOND ADVANCED .7 DNX12 (GAUZE/BANDAGES/DRESSINGS) ×2 IMPLANT
DRAPE ARM DVNC X/XI (DISPOSABLE) ×6 IMPLANT
DRAPE COLUMN DVNC XI (DISPOSABLE) ×2 IMPLANT
ELECTRODE REM PT RTRN 9FT ADLT (ELECTROSURGICAL) ×2 IMPLANT
FORCEPS BPLR FENES DVNC XI (FORCEP) ×2 IMPLANT
GLOVE BIO SURGEON STRL SZ 6.5 (GLOVE) ×4 IMPLANT
GLOVE BIOGEL PI IND STRL 6.5 (GLOVE) ×4 IMPLANT
GLOVE SURG SYN 6.5 PF PI (GLOVE) ×4 IMPLANT
GOWN STRL REUS W/ TWL LRG LVL3 (GOWN DISPOSABLE) ×8 IMPLANT
GRASPER SUT TROCAR 14GX15 (MISCELLANEOUS) IMPLANT
IRRIGATOR SUCT 8 DISP DVNC XI (IRRIGATION / IRRIGATOR) IMPLANT
IV 0.9% NACL 1000 ML (IV SOLUTION) IMPLANT
IV CATH ANGIO 12GX3 LT BLUE (NEEDLE) IMPLANT
KIT PINK PAD W/HEAD ARM REST (MISCELLANEOUS) ×2 IMPLANT
LABEL OR SOLS (LABEL) ×2 IMPLANT
MANIFOLD NEPTUNE II (INSTRUMENTS) ×2 IMPLANT
MESH HERNIA 6X6 BARD (Mesh General) IMPLANT
NDL DRIVE SUT CUT DVNC (INSTRUMENTS) ×2 IMPLANT
NDL HYPO 22X1.5 SAFETY MO (MISCELLANEOUS) ×2 IMPLANT
NDL INSUFFLATION 14GA 120MM (NEEDLE) ×2 IMPLANT
NS IRRIG 500ML POUR BTL (IV SOLUTION) ×2 IMPLANT
OBTURATOR OPTICALSTD 8 DVNC (TROCAR) ×2 IMPLANT
PACK LAP CHOLECYSTECTOMY (MISCELLANEOUS) ×2 IMPLANT
SCISSORS MNPLR CVD DVNC XI (INSTRUMENTS) ×2 IMPLANT
SEAL UNIV 5-12 XI (MISCELLANEOUS) ×6 IMPLANT
SET TUBE SMOKE EVAC HIGH FLOW (TUBING) ×2 IMPLANT
SOLN STERILE WATER 500 ML (IV SOLUTION) ×2 IMPLANT
SOLUTION ELECTROSURG ANTI STCK (MISCELLANEOUS) ×2 IMPLANT
SUT STRATA 2-0 30 CT-2 (SUTURE) ×4 IMPLANT
SUT STRATAFIX PDS 30 CT-1 (SUTURE) ×2 IMPLANT
SUT VIC AB 3-0 SH 27X BRD (SUTURE) IMPLANT
SUT VICRYL 0 UR6 27IN ABS (SUTURE) ×2 IMPLANT
SUTURE MNCRL 4-0 27XMF (SUTURE) ×2 IMPLANT

## 2024-01-09 NOTE — Progress Notes (Signed)
 Per Dr. Rodolph patient does not have to void prior to discharge.

## 2024-01-09 NOTE — Interval H&P Note (Signed)
 History and Physical Interval Note:  01/09/2024 7:43 AM  Eric Boyer  has presented today for surgery, with the diagnosis of K45.8 lumbar hernia.  The various methods of treatment have been discussed with the patient and family. After consideration of risks, benefits and other options for treatment, the patient has consented to  Procedure(s) with comments: REPAIR, HERNIA, FLANK, ROBOT-ASSISTED (Right) - place pt in LT lateral decubitis position as a surgical intervention.  The patient's history has been reviewed, patient examined, no change in status, stable for surgery.  I have reviewed the patient's chart and labs.  Questions were answered to the patient's satisfaction.     Lucas Sjogren

## 2024-01-09 NOTE — Op Note (Signed)
 Preoperative diagnosis: Right lumbar Hernia   Postoperative diagnosis: Right Lumbar Hernia   Procedure: Robotic assisted laparoscopic transabdominal pre peritoneal right lumbar hernia repair with mesh  Anesthesia: General   Surgeon: Lucas Sjogren, MD, FACS   Wound Classification: Clean   Specimen: None   Complications: None   Estimated Blood Loss: 5 mL   Indications: A 73 year old male with symptomatic and incarcerated right lumbar hernia. Repair indicated to improve pain and avoid complications such as strangulation.    Findings: 4 cm right lumbar hernia hernia with incarcerated fat  2. Tension free repair achieved with 15 x 15 cm Bard mesh  3. Adequate hemostasis            Description of procedure: The patient was brought to the operating room and general anesthesia was induced. A time-out was completed verifying correct patient, procedure, site, positioning, and implant(s) and/or special equipment prior to beginning this procedure. Patient was placed in left lateral decubitus position. Antibiotics were administered prior to making the incision. SCDs placed. The anterior abdominal wall was prepped and draped in the standard sterile fashion.    Cutdown was done in the right upper quadrant and 12 mm trocar inserted using Hasson technique. Abdomen insufflated to 15 mmHg without any dramatic increase in pressure.  No injury noted during placement. Two additional ports were placed along right lateral aspect.  Xi robot then docked into place.     A pre peritoneal flap was started 6 cm medial to the lumbar hernia defect. The pre peritoneal flap was extended 5 cm away from the hernia defect in all directions. The hernia sac and content were reduced.    Insufflation dropped to 8 mm and transfacial suture with 0 stratafix used to primarily close defect under minimal tension. Bard Mesh 15 cm x 15 cm  mesh was placed within the the pre peritoneal flap and fixed with multiple 3-0  vicryl sutures in all cardinal points to the wall centered over the defect.  The pre peritoneal flap was closed using 2-0 Stratafix.     Robot was undocked.  Abdomen then desufflated while camera within abdomen to ensure no signs of new bleed prior to removing camera and rest of ports completely.  All skin incisions closed with 4-0 Monocryl in a subcuticular fashion.  All wounds then dressed with Dermabond.   Patient was then successfully awakened and transferred to PACU in stable condition.  At the end of the procedure sponge and instrument counts were correct.

## 2024-01-09 NOTE — Anesthesia Preprocedure Evaluation (Signed)
 Anesthesia Evaluation  Patient identified by MRN, date of birth, ID band Patient awake    Reviewed: Allergy & Precautions, NPO status , Patient's Chart, lab work & pertinent test results  History of Anesthesia Complications (+) PONV and history of anesthetic complications  Airway Mallampati: III  TM Distance: >3 FB Neck ROM: full    Dental  (+) Missing, Dental Advidsory Given   Pulmonary shortness of breath and with exertion, asthma , COPD, Recent URI , Resolved, former smoker   Pulmonary exam normal        Cardiovascular Exercise Tolerance: Good (-) angina (-) Past MI and (-) DOE Normal cardiovascular exam     Neuro/Psych negative neurological ROS  negative psych ROS   GI/Hepatic Neg liver ROS,GERD  Controlled,,  Endo/Other  negative endocrine ROS    Renal/GU Renal disease (kidney stones)     Musculoskeletal   Abdominal   Peds  Hematology negative hematology ROS (+)   Anesthesia Other Findings Past Medical History: No date: Asthma No date: BPH with obstruction/lower urinary tract symptoms No date: Complication of anesthesia No date: COPD (chronic obstructive pulmonary disease) (HCC) No date: Gastroesophageal reflux disease, unspecified whether  esophagitis present No date: Hyperlipidemia No date: Major depressive disorder, recurrent, mild (HCC) No date: Non-recurrent unilateral inguinal hernia without obstruction  or gangrene No date: Osteoarthritis No date: PONV (postoperative nausea and vomiting) No date: Right knee pain, unspecified chronicity  Past Surgical History: 2018: brain tumor surgery No date: CATARACT EXTRACTION No date: COLONOSCOPY 10/02/2014: COLONOSCOPY WITH PROPOFOL ; N/A     Comment:  Procedure: COLONOSCOPY WITH PROPOFOL ;  Surgeon: Lamar ONEIDA Holmes, MD;  Location: [facility] ENDOSCOPY;  Service:               Endoscopy;  Laterality: N/A; No date: ESOPHAGOGASTRODUODENOSCOPY No  date: NOSE SURGERY No date: RETINAL DETACHMENT SURGERY  BMI    Body Mass Index: 22.38 kg/m      Reproductive/Obstetrics negative OB ROS                              Anesthesia Physical Anesthesia Plan  ASA: 3  Anesthesia Plan: General   Post-op Pain Management:    Induction: Intravenous  PONV Risk Score and Plan: 3 and Ondansetron , Dexamethasone , Midazolam  and Treatment may vary due to age or medical condition  Airway Management Planned: Oral ETT  Additional Equipment:   Intra-op Plan:   Post-operative Plan: Extubation in OR  Informed Consent: I have reviewed the patients History and Physical, chart, labs and discussed the procedure including the risks, benefits and alternatives for the proposed anesthesia with the patient or authorized representative who has indicated his/her understanding and acceptance.     Dental Advisory Given  Plan Discussed with: Anesthesiologist, CRNA and Surgeon  Anesthesia Plan Comments: (Patient consented for risks of anesthesia including but not limited to:  - adverse reactions to medications - damage to eyes, teeth, lips or other oral mucosa - nerve damage due to positioning  - sore throat or hoarseness - Damage to heart, brain, nerves, lungs, other parts of body or loss of life  Patient voiced understanding and assent.)        Anesthesia Quick Evaluation

## 2024-01-09 NOTE — Transfer of Care (Signed)
 Immediate Anesthesia Transfer of Care Note  Patient: Eric Boyer  Procedure(s) Performed: REPAIR, HERNIA, FLANK, ROBOT-ASSISTED (Right) INSERTION OF MESH  Patient Location: PACU  Anesthesia Type:General  Level of Consciousness: awake and confused  Airway & Oxygen Therapy: Patient Spontanous Breathing and Patient connected to face mask oxygen  Post-op Assessment: Report given to RN and Patient moving all extremities  Post vital signs: Reviewed and stable  Last Vitals:  Vitals Value Taken Time  BP 136/68 01/09/24 11:06  Temp    Pulse 92 01/09/24 11:11  Resp 24 01/09/24 11:11  SpO2 97 % 01/09/24 11:11  Vitals shown include unfiled device data.  Last Pain:  Vitals:   01/09/24 0712  TempSrc:   PainSc: 0-No pain         Complications: No notable events documented.

## 2024-01-09 NOTE — Discharge Instructions (Addendum)

## 2024-01-09 NOTE — Anesthesia Procedure Notes (Addendum)
 Procedure Name: Intubation Date/Time: 01/09/2024 8:38 AM  Performed by: Veronica Alm BROCKS, CRNAPre-anesthesia Checklist: Patient identified, Patient being monitored, Timeout performed, Emergency Drugs available and Suction available Patient Re-evaluated:Patient Re-evaluated prior to induction Oxygen Delivery Method: Circle system utilized Preoxygenation: Pre-oxygenation with 100% oxygen Induction Type: IV induction Ventilation: Mask ventilation without difficulty Laryngoscope Size: 3 and McGrath Grade View: Grade I Tube type: Oral Tube size: 7.0 mm Number of attempts: 2 Airway Equipment and Method: Stylet Placement Confirmation: ETT inserted through vocal cords under direct vision, positive ETCO2 and breath sounds checked- equal and bilateral Secured at: 24 cm Tube secured with: Tape Dental Injury: Teeth and Oropharynx as per pre-operative assessment  Comments: First attempt successful, but dislodged during ventilation and prior to taping. Reinserted deep. Bilateral breath sounds confirmed.

## 2024-01-10 ENCOUNTER — Encounter: Payer: Self-pay | Admitting: General Surgery

## 2024-01-10 NOTE — Anesthesia Postprocedure Evaluation (Signed)
 Anesthesia Post Note  Patient: MADDIX HEINZ  Procedure(s) Performed: REPAIR, HERNIA, FLANK, ROBOT-ASSISTED (Right) INSERTION OF MESH  Patient location during evaluation: PACU Anesthesia Type: General Level of consciousness: awake and alert Pain management: pain level controlled Vital Signs Assessment: post-procedure vital signs reviewed and stable Respiratory status: spontaneous breathing, nonlabored ventilation, respiratory function stable and patient connected to nasal cannula oxygen Cardiovascular status: blood pressure returned to baseline and stable Postop Assessment: no apparent nausea or vomiting Anesthetic complications: no   No notable events documented.   Last Vitals:  Vitals:   01/09/24 1142 01/09/24 1158  BP: 123/73 (!) 132/56  Pulse:  85  Resp:  18  Temp:  (!) 36.2 C  SpO2:  94%    Last Pain:  Vitals:   01/09/24 1158  TempSrc: Temporal  PainSc: 3                  Prentice Murphy
